# Patient Record
Sex: Male | Born: 1960 | Race: Black or African American | Hispanic: No | Marital: Married | Smoking: Former smoker
Health system: Southern US, Community
[De-identification: ages and names within clinical notes are randomized; demographics above are authoritative.]

## PROBLEM LIST (undated history)

## (undated) DIAGNOSIS — F329 Major depressive disorder, single episode, unspecified: Secondary | ICD-10-CM

## (undated) DIAGNOSIS — F32A Depression, unspecified: Secondary | ICD-10-CM

## (undated) DIAGNOSIS — L80 Vitiligo: Secondary | ICD-10-CM

## (undated) DIAGNOSIS — C801 Malignant (primary) neoplasm, unspecified: Secondary | ICD-10-CM

## (undated) DIAGNOSIS — I509 Heart failure, unspecified: Secondary | ICD-10-CM

## (undated) DIAGNOSIS — E785 Hyperlipidemia, unspecified: Secondary | ICD-10-CM

## (undated) DIAGNOSIS — G4730 Sleep apnea, unspecified: Secondary | ICD-10-CM

## (undated) DIAGNOSIS — I10 Essential (primary) hypertension: Secondary | ICD-10-CM

## (undated) DIAGNOSIS — N19 Unspecified kidney failure: Secondary | ICD-10-CM

## (undated) HISTORY — DX: Unspecified kidney failure: N19

## (undated) HISTORY — DX: Major depressive disorder, single episode, unspecified: F32.9

## (undated) HISTORY — DX: Hyperlipidemia, unspecified: E78.5

## (undated) HISTORY — DX: Depression, unspecified: F32.A

## (undated) HISTORY — PX: PROSTATE BIOPSY: SHX241

---

## 2010-01-12 DIAGNOSIS — I480 Paroxysmal atrial fibrillation: Secondary | ICD-10-CM | POA: Insufficient documentation

## 2010-01-12 DIAGNOSIS — G4733 Obstructive sleep apnea (adult) (pediatric): Secondary | ICD-10-CM | POA: Insufficient documentation

## 2014-12-13 ENCOUNTER — Encounter: Payer: Self-pay | Admitting: *Deleted

## 2014-12-16 ENCOUNTER — Encounter
Admission: RE | Disposition: A | Discharge status: Home / Self Care | Payer: Self-pay | Source: Ambulatory Visit | Attending: Gastroenterology

## 2014-12-16 ENCOUNTER — Encounter: Payer: Self-pay | Admitting: *Deleted

## 2014-12-16 ENCOUNTER — Ambulatory Visit
Admission: RE | Admit: 2014-12-16 | Discharge: 2014-12-16 | Disposition: A | Discharge status: Home / Self Care | Payer: No Typology Code available for payment source | Source: Ambulatory Visit | Attending: Gastroenterology | Admitting: Gastroenterology

## 2014-12-16 ENCOUNTER — Ambulatory Visit: Payer: No Typology Code available for payment source | Admitting: Anesthesiology

## 2014-12-16 DIAGNOSIS — Z1211 Encounter for screening for malignant neoplasm of colon: Secondary | ICD-10-CM | POA: Diagnosis not present

## 2014-12-16 DIAGNOSIS — G4730 Sleep apnea, unspecified: Secondary | ICD-10-CM | POA: Insufficient documentation

## 2014-12-16 DIAGNOSIS — Z8371 Family history of colonic polyps: Secondary | ICD-10-CM | POA: Insufficient documentation

## 2014-12-16 DIAGNOSIS — I509 Heart failure, unspecified: Secondary | ICD-10-CM | POA: Insufficient documentation

## 2014-12-16 DIAGNOSIS — Z7982 Long term (current) use of aspirin: Secondary | ICD-10-CM | POA: Diagnosis not present

## 2014-12-16 DIAGNOSIS — K640 First degree hemorrhoids: Secondary | ICD-10-CM | POA: Insufficient documentation

## 2014-12-16 DIAGNOSIS — D649 Anemia, unspecified: Secondary | ICD-10-CM | POA: Insufficient documentation

## 2014-12-16 DIAGNOSIS — K5730 Diverticulosis of large intestine without perforation or abscess without bleeding: Secondary | ICD-10-CM | POA: Diagnosis not present

## 2014-12-16 DIAGNOSIS — I110 Hypertensive heart disease with heart failure: Secondary | ICD-10-CM | POA: Diagnosis not present

## 2014-12-16 DIAGNOSIS — Z87891 Personal history of nicotine dependence: Secondary | ICD-10-CM | POA: Diagnosis not present

## 2014-12-16 HISTORY — DX: Heart failure, unspecified: I50.9

## 2014-12-16 HISTORY — DX: Vitiligo: L80

## 2014-12-16 HISTORY — PX: COLONOSCOPY WITH PROPOFOL: SHX5780

## 2014-12-16 HISTORY — DX: Sleep apnea, unspecified: G47.30

## 2014-12-16 HISTORY — DX: Essential (primary) hypertension: I10

## 2014-12-16 SURGERY — COLONOSCOPY WITH PROPOFOL
Anesthesia: General

## 2014-12-16 MED ORDER — SODIUM CHLORIDE 0.9 % IV SOLN
INTRAVENOUS | Status: DC
  Administered 2014-12-16: 08:00:00 via INTRAVENOUS

## 2014-12-16 MED ORDER — PROPOFOL 10 MG/ML IV BOLUS
INTRAVENOUS | Status: DC | PRN
  Administered 2014-12-16: 70 mg via INTRAVENOUS
  Administered 2014-12-16 (×2): 40 mg via INTRAVENOUS

## 2014-12-16 MED ORDER — PROPOFOL 500 MG/50ML IV EMUL
INTRAVENOUS | Status: DC | PRN
  Administered 2014-12-16: 150 ug/kg/min via INTRAVENOUS

## 2014-12-16 MED ORDER — LIDOCAINE HCL (CARDIAC) 20 MG/ML IV SOLN
INTRAVENOUS | Status: DC | PRN
  Administered 2014-12-16: 60 mg via INTRAVENOUS

## 2014-12-16 NOTE — H&P (Signed)
  Primary Care Physician:  Valera Castle, MD  Pre-Procedure History & Physical: HPI:  Alejandro Winters is a 54 y.o. male is here for an colonoscopy.   Past Medical History  Diagnosis Date  . Hypertension   . Sleep apnea   . Vitiligo   . CHF (congestive heart failure) ([place])     History reviewed. No pertinent past surgical history.  Prior to Admission medications   Medication Sig Start Date End Date Taking? Authorizing Provider  aspirin EC 81 MG tablet Take 81 mg by mouth daily.   Yes Historical Provider, MD  co-enzyme Q-10 30 MG capsule Take 30 mg by mouth 3 (three) times daily.   Yes Historical Provider, MD  hydrochlorothiazide (HYDRODIURIL) 25 MG tablet Take 25 mg by mouth daily.   Yes Historical Provider, MD  polyethylene glycol-electrolytes (NULYTELY/GOLYTELY) 420 G solution Take 4,000 mLs by mouth once.   Yes Historical Provider, MD    Allergies as of 11/20/2014  . (Not on File)    History reviewed. No pertinent family history.  Social History   Social History  . Marital Status: Married    Spouse Name: N/A  . Number of Children: N/A  . Years of Education: N/A   Occupational History  . Not on file.   Social History Main Topics  . Smoking status: Former Smoker -- 0.50 packs/day for 1 years    Types: Cigarettes    Quit date: 10/28/1998  . Smokeless tobacco: Never Used  . Alcohol Use: No  . Drug Use: Yes    Special: Marijuana  . Sexual Activity: Not on file   Other Topics Concern  . Not on file   Social History Narrative     Physical Exam: BP 154/93 mmHg  Pulse 73  Temp(Src) 98.8 F (37.1 C) (Tympanic)  Resp 12  Ht 5\' 11"  (1.803 m)  Wt 120.203 kg (265 lb)  BMI 36.98 kg/m2  SpO2 98% General:   Alert,  pleasant and cooperative in NAD Head:  Normocephalic and atraumatic. Neck:  Supple; no masses or thyromegaly. Lungs:  Clear throughout to auscultation.    Heart:  Regular rate and rhythm. Abdomen:  Soft, nontender and nondistended. Normal bowel  sounds, without guarding, and without rebound.   Neurologic:  Alert and  oriented x4;  grossly normal neurologically.  Impression/Plan: Alejandro Winters is here for an colonoscopy to be performed for screening  Risks, benefits, limitations, and alternatives regarding  colonoscopy have been reviewed with the patient.  Questions have been answered.  All parties agreeable.   Josefine Class, MD  12/16/2014, 8:12 AM

## 2014-12-16 NOTE — Op Note (Signed)
[facility] Patient Name: Alejandro Winters Procedure Date: 12/16/2014 8:13 AM MRN: [id number] Account #: [id number] Date of Birth: 1961/01/02 Admit Type: Outpatient Age: 54 Room: [facility] ENDO ROOM 1 Gender: Male Note Status: Finalized Procedure:         Colonoscopy Indications:       Colon cancer screening in patient at increased risk:                     Family history of colon polyps, This is the patient's                     first colonoscopy Patient Profile:   This is a 54 year old male. Providers:         Gerrit Heck. Rayann Heman, MD Referring MD:      Wynona Canes. Kym Groom, MD (Referring MD) Medicines:         Propofol per Anesthesia Complications:     No immediate complications. Procedure:         Pre-Anesthesia Assessment:                    - Prior to the procedure, a History and Physical was                     performed, and patient medications, allergies and                     sensitivities were reviewed. The patient's tolerance of                     previous anesthesia was reviewed.                    After obtaining informed consent, the colonoscope was                     passed under direct vision. Throughout the procedure, the                     patient's blood pressure, pulse, and oxygen saturations                     were monitored continuously. The Colonoscope was                     introduced through the anus and advanced to the the cecum,                     identified by appendiceal orifice and ileocecal valve. The                     colonoscopy was performed without difficulty. The patient                     tolerated the procedure well. The quality of the bowel                     preparation was excellent. Findings:      The perianal and digital rectal examinations were normal.      Many small and large-mouthed diverticula were found in the sigmoid       colon, in the descending colon and at the splenic flexure.      Internal  hemorrhoids were found during retroflexion. The hemorrhoids       were Grade I (  internal hemorrhoids that do not prolapse).      The exam was otherwise without abnormality. Impression:        - Diverticulosis in the sigmoid colon, in the descending                     colon and at the splenic flexure.                    - Internal hemorrhoids.                    - The examination was otherwise normal.                    - No specimens collected. Recommendation:    - Observe patient in GI recovery unit.                    - High fiber diet.                    - Continue present medications.                    - Repeat colonoscopy in 5 years for screening purposes.                    - Return to referring physician.                    - The findings and recommendations were discussed with the                     patient.                    - The findings and recommendations were discussed with the                     patient's family. Procedure Code(s): --- Professional ---                    [phone], Colonoscopy, flexible; diagnostic, including                     collection of specimen(s) by brushing or washing, when                     performed (separate procedure) CPT copyright 2014 American [employer] Association. All rights reserved. The codes documented in this report are preliminary and upon coder review may  be revised to meet current compliance requirements. Mellody Life, MD 12/16/2014 8:38:48 AM This report has been signed electronically. Number of Addenda: 0 Note Initiated On: 12/16/2014 8:13 AM Scope Withdrawal Time: 0 hours 9 minutes 19 seconds  Total Procedure Duration: 0 hours 15 minutes 23 seconds       [facility]

## 2014-12-16 NOTE — Transfer of Care (Signed)
Immediate Anesthesia Transfer of Care Note  Patient: Alejandro Winters  Procedure(s) Performed: Procedure(s): COLONOSCOPY WITH PROPOFOL (N/A)  Patient Location: PACU  Anesthesia Type:General  Level of Consciousness: awake, alert  and oriented  Airway & Oxygen Therapy: Patient Spontanous Breathing and Patient connected to nasal cannula oxygen  Post-op Assessment: Report given to RN and Post -op Vital signs reviewed and stable  Post vital signs: stable  Last Vitals:  Filed Vitals:   12/16/14 0841  BP: 136/96  Pulse: 94  Temp: 36 C  Resp: 16    Complications: No apparent anesthesia complications

## 2014-12-16 NOTE — Anesthesia Postprocedure Evaluation (Signed)
  Anesthesia Post-op Note  Patient: Alejandro Winters  Procedure(s) Performed: Procedure(s): COLONOSCOPY WITH PROPOFOL (N/A)  Anesthesia type:General  Patient location: PACU  Post pain: Pain level controlled  Post assessment: Post-op Vital signs reviewed, Patient's Cardiovascular Status Stable, Respiratory Function Stable, Patent Airway and No signs of Nausea or vomiting  Post vital signs: Reviewed and stable  Last Vitals:  Filed Vitals:   12/16/14 0910  BP: 141/94  Pulse: 68  Temp:   Resp: 9    Level of consciousness: awake, alert  and patient cooperative  Complications: No apparent anesthesia complications

## 2014-12-16 NOTE — Anesthesia Preprocedure Evaluation (Signed)
Anesthesia Evaluation  Patient identified by MRN, date of birth, ID band Patient awake    Reviewed: Allergy & Precautions, NPO status , Patient's Chart, lab work & pertinent test results  History of Anesthesia Complications Negative for: history of anesthetic complications  Airway Mallampati: II       Dental  (+) Missing, Chipped   Pulmonary sleep apnea and Continuous Positive Airway Pressure Ventilation , former smoker,           Cardiovascular hypertension, Pt. on medications +CHF       Neuro/Psych negative neurological ROS     GI/Hepatic negative GI ROS, Neg liver ROS,   Endo/Other  negative endocrine ROS  Renal/GU negative Renal ROS     Musculoskeletal   Abdominal   Peds  Hematology  (+) anemia ,   Anesthesia Other Findings   Reproductive/Obstetrics                             Anesthesia Physical Anesthesia Plan  ASA: III  Anesthesia Plan: General   Post-op Pain Management:    Induction: Intravenous  Airway Management Planned: Nasal Cannula  Additional Equipment:   Intra-op Plan:   Post-operative Plan:   Informed Consent: I have reviewed the patients History and Physical, chart, labs and discussed the procedure including the risks, benefits and alternatives for the proposed anesthesia with the patient or authorized representative who has indicated his/her understanding and acceptance.     Plan Discussed with:   Anesthesia Plan Comments:         Anesthesia Quick Evaluation

## 2014-12-16 NOTE — Discharge Instructions (Signed)

## 2014-12-18 ENCOUNTER — Encounter: Payer: Self-pay | Admitting: Gastroenterology

## 2015-05-08 ENCOUNTER — Ambulatory Visit (INDEPENDENT_AMBULATORY_CARE_PROVIDER_SITE_OTHER): Payer: BLUE CROSS/BLUE SHIELD | Admitting: Family Medicine

## 2015-05-08 ENCOUNTER — Encounter: Payer: Self-pay | Admitting: Family Medicine

## 2015-05-08 VITALS — BP 144/98 | HR 72 | Ht 71.0 in | Wt 287.0 lb

## 2015-05-08 DIAGNOSIS — I5032 Chronic diastolic (congestive) heart failure: Secondary | ICD-10-CM | POA: Diagnosis not present

## 2015-05-08 DIAGNOSIS — Z7189 Other specified counseling: Secondary | ICD-10-CM

## 2015-05-08 DIAGNOSIS — Z7689 Persons encountering health services in other specified circumstances: Secondary | ICD-10-CM

## 2015-05-08 DIAGNOSIS — Z8679 Personal history of other diseases of the circulatory system: Secondary | ICD-10-CM | POA: Diagnosis not present

## 2015-05-08 DIAGNOSIS — G4730 Sleep apnea, unspecified: Secondary | ICD-10-CM

## 2015-05-08 DIAGNOSIS — R002 Palpitations: Secondary | ICD-10-CM

## 2015-05-08 DIAGNOSIS — E785 Hyperlipidemia, unspecified: Secondary | ICD-10-CM

## 2015-05-08 DIAGNOSIS — I10 Essential (primary) hypertension: Secondary | ICD-10-CM

## 2015-05-08 MED ORDER — LISINOPRIL 10 MG PO TABS: 10.0000 mg | ORAL_TABLET | Freq: Every day | ORAL | Status: DC

## 2015-05-08 NOTE — Progress Notes (Signed)
Name: Alejandro Winters   MRN: [id number]    DOB: 12/25/1960   Date:05/08/2015       Progress Note  Subjective  Chief Complaint  Chief Complaint  Patient presents with  . Establish Care  . Sleep Apnea    needs new equipment  . Hypertension    questions about HCTZ  . Palpitations    Hypertension This is a chronic problem. The current episode started more than 1 year ago. The problem has been waxing and waning since onset. The problem is uncontrolled. Pertinent negatives include no anxiety, blurred vision, chest pain, headaches, malaise/fatigue, neck pain, orthopnea, palpitations, peripheral edema, PND, shortness of breath or sweats. There are no associated agents to hypertension. Risk factors for coronary artery disease include male gender and smoking/tobacco exposure. The current treatment provides no improvement. Compliance problems include medication side effects.  There is no history of angina, kidney disease, CAD/MI, CVA, heart failure, left ventricular hypertrophy, PVD, renovascular disease or retinopathy. There is no history of chronic renal disease or a hypertension causing med.  Congestive Heart Failure Presents for initial visit. The disease course has been stable. Pertinent negatives include no abdominal pain, chest pain, chest pressure, claudication, edema, fatigue, muscle weakness, near-syncope, nocturia, orthopnea, palpitations, paroxysmal nocturnal dyspnea, shortness of breath or unexpected weight change. The symptoms have been stable. Past treatments include ACE inhibitors and beta blockers. Compliance with prior treatments has been variable. Prior compliance problems include insurance issues and medication issues. His past medical history is significant for anemia. There is no history of hyperthyroidism.  Palpitations  This is a recurrent problem. The problem occurs intermittently. Nothing (supine) aggravates the symptoms. Pertinent negatives include no anxiety, chest fullness, chest  pain, coughing, dizziness, fever, malaise/fatigue, nausea, near-syncope or shortness of breath. He has tried beta blockers for the symptoms. There is no history of heart disease, hyperthyroidism or a valve disorder.    No problem-specific assessment & plan notes found for this encounter.   Past Medical History  Diagnosis Date  . Hypertension   . Sleep apnea   . Vitiligo   . CHF (congestive heart failure) [facility])     Past Surgical History  Procedure Laterality Date  . Colonoscopy with propofol N/A 12/16/2014    Procedure: COLONOSCOPY WITH PROPOFOL;  Surgeon: Josefine Class, MD;  Location: [facility] ENDOSCOPY;  Service: Endoscopy;  Laterality: N/A;    Family History  Problem Relation Age of Onset  . Hypertension Mother   . Cancer Father   . Hypertension Father     Social History   Social History  . Marital Status: Married    Spouse Name: N/A  . Number of Children: N/A  . Years of Education: N/A   Occupational History  . Not on file.   Social History Main Topics  . Smoking status: Former Smoker -- 0.50 packs/day for 1 years    Types: Cigarettes    Quit date: 10/28/1998  . Smokeless tobacco: Never Used  . Alcohol Use: 0.0 oz/week    0 Standard drinks or equivalent per week  . Drug Use: Yes    Special: Marijuana     Comment: marijuana  . Sexual Activity: Not Currently   Other Topics Concern  . Not on file   Social History Narrative    No Known Allergies   Review of Systems  Constitutional: Negative for fever, chills, weight loss, malaise/fatigue, fatigue and unexpected weight change.  HENT: Negative for ear discharge, ear pain and sore throat.   Eyes: Negative  for blurred vision.  Respiratory: Negative for cough, sputum production, shortness of breath and wheezing.   Cardiovascular: Negative for chest pain, palpitations, orthopnea, claudication, leg swelling, PND and near-syncope.  Gastrointestinal: Negative for heartburn, nausea, abdominal pain, diarrhea,  constipation, blood in stool and melena.  Genitourinary: Negative for dysuria, urgency, frequency, hematuria and nocturia.  Musculoskeletal: Negative for myalgias, back pain, joint pain, muscle weakness and neck pain.  Skin: Negative for rash.  Neurological: Negative for dizziness, tingling, sensory change, focal weakness and headaches.  Endo/Heme/Allergies: Negative for environmental allergies and polydipsia. Does not bruise/bleed easily.  Psychiatric/Behavioral: Negative for depression and suicidal ideas. The patient is not nervous/anxious and does not have insomnia.      Objective  Filed Vitals:   05/08/15 1039  BP: 144/98  Pulse: 72  Height: 5\' 11"  (1.803 m)  Weight: 287 lb (130.182 kg)    Physical Exam  Constitutional: He is oriented to person, place, and time and well-developed, well-nourished, and in no distress.  HENT:  Head: Normocephalic.  Right Ear: External ear normal.  Left Ear: External ear normal.  Nose: Nose normal.  Mouth/Throat: Oropharynx is clear and moist.  Eyes: Conjunctivae and EOM are normal. Pupils are equal, round, and reactive to light. Right eye exhibits no discharge. Left eye exhibits no discharge. No scleral icterus.  Neck: Normal range of motion. Neck supple. No JVD present. No tracheal deviation present. No thyromegaly present.  Cardiovascular: Normal rate, regular rhythm, normal heart sounds and intact distal pulses.  Exam reveals no gallop and no friction rub.   No murmur heard. Pulmonary/Chest: Breath sounds normal. No respiratory distress. He has no wheezes. He has no rales.  Abdominal: Soft. Bowel sounds are normal. He exhibits no mass. There is no hepatosplenomegaly. There is no tenderness. There is no rebound, no guarding and no CVA tenderness.  Musculoskeletal: Normal range of motion. He exhibits no edema or tenderness.  Lymphadenopathy:    He has no cervical adenopathy.  Neurological: He is alert and oriented to person, place, and time. He  has normal sensation, normal strength, normal reflexes and intact cranial nerves. No cranial nerve deficit.  Skin: Skin is warm. No rash noted.  Psychiatric: Mood and affect normal.      Assessment & Plan  Problem List Items Addressed This Visit    None    Visit Diagnoses    Encounter to establish care with new doctor    -  Primary    Palpitation        Relevant Orders    EKG 12-Lead (Completed)    Chronic diastolic congestive heart failure (HCC)        Relevant Medications    lisinopril (PRINIVIL,ZESTRIL) 10 MG tablet    Other Relevant Orders    Ambulatory referral to Cardiology    Essential hypertension        Relevant Medications    lisinopril (PRINIVIL,ZESTRIL) 10 MG tablet    History of atrial fibrillation        Hyperlipidemia        Relevant Medications    lisinopril (PRINIVIL,ZESTRIL) 10 MG tablet    Sleep apnea             Dr. Zuriel Yeaman [facility]  05/08/2015

## 2015-05-22 ENCOUNTER — Ambulatory Visit (INDEPENDENT_AMBULATORY_CARE_PROVIDER_SITE_OTHER): Payer: BLUE CROSS/BLUE SHIELD | Admitting: Family Medicine

## 2015-05-22 ENCOUNTER — Encounter: Payer: Self-pay | Admitting: Family Medicine

## 2015-05-22 VITALS — BP 130/98 | HR 68 | Ht 71.0 in | Wt 286.0 lb

## 2015-05-22 DIAGNOSIS — I10 Essential (primary) hypertension: Secondary | ICD-10-CM | POA: Diagnosis not present

## 2015-05-22 NOTE — Progress Notes (Signed)
Name: Alejandro Winters   MRN: [id number]    DOB: 1960/08/29   Date:05/22/2015       Progress Note  Subjective  Chief Complaint  Chief Complaint  Patient presents with  . Hypertension    Hypertension This is a chronic problem. The current episode started more than 1 year ago. The problem has been gradually improving since onset. The problem is uncontrolled. Pertinent negatives include no anxiety, blurred vision, chest pain, headaches, malaise/fatigue, neck pain, orthopnea, palpitations, peripheral edema, PND, shortness of breath or sweats. There are no associated agents to hypertension.    No problem-specific assessment & plan notes found for this encounter.   Past Medical History  Diagnosis Date  . Hypertension   . Sleep apnea   . Vitiligo   . CHF (congestive heart failure) [facility])     Past Surgical History  Procedure Laterality Date  . Colonoscopy with propofol N/A 12/16/2014    Procedure: COLONOSCOPY WITH PROPOFOL;  Surgeon: Josefine Class, MD;  Location: [facility] ENDOSCOPY;  Service: Endoscopy;  Laterality: N/A;    Family History  Problem Relation Age of Onset  . Hypertension Mother   . Cancer Father   . Hypertension Father     Social History   Social History  . Marital Status: Married    Spouse Name: N/A  . Number of Children: N/A  . Years of Education: N/A   Occupational History  . Not on file.   Social History Main Topics  . Smoking status: Former Smoker -- 0.50 packs/day for 1 years    Types: Cigarettes    Quit date: 10/28/1998  . Smokeless tobacco: Never Used  . Alcohol Use: 0.0 oz/week    0 Standard drinks or equivalent per week  . Drug Use: Yes    Special: Marijuana     Comment: marijuana  . Sexual Activity: Not Currently   Other Topics Concern  . Not on file   Social History Narrative    No Known Allergies   Review of Systems  Constitutional: Negative for fever, chills, weight loss and malaise/fatigue.  HENT: Negative for ear discharge,  ear pain and sore throat.   Eyes: Negative for blurred vision.  Respiratory: Negative for cough, sputum production, shortness of breath and wheezing.   Cardiovascular: Negative for chest pain, palpitations, orthopnea, leg swelling and PND.  Gastrointestinal: Negative for heartburn, nausea, abdominal pain, diarrhea, constipation, blood in stool and melena.  Genitourinary: Negative for dysuria, urgency, frequency and hematuria.  Musculoskeletal: Negative for myalgias, back pain, joint pain and neck pain.  Skin: Negative for rash.  Neurological: Negative for dizziness, tingling, sensory change, focal weakness and headaches.  Endo/Heme/Allergies: Negative for environmental allergies and polydipsia. Does not bruise/bleed easily.  Psychiatric/Behavioral: Negative for depression and suicidal ideas. The patient is not nervous/anxious and does not have insomnia.      Objective  Filed Vitals:   05/22/15 0919  BP: 130/98  Pulse: 68  Height: 5\' 11"  (1.803 m)  Weight: 286 lb (129.729 kg)    Physical Exam  Constitutional: He is oriented to person, place, and time and well-developed, well-nourished, and in no distress.  HENT:  Head: Normocephalic.  Right Ear: External ear normal.  Left Ear: External ear normal.  Nose: Nose normal.  Mouth/Throat: Oropharynx is clear and moist.  Eyes: Conjunctivae and EOM are normal. Pupils are equal, round, and reactive to light. Right eye exhibits no discharge. Left eye exhibits no discharge. No scleral icterus.  Neck: Normal range of motion. Neck supple.  No JVD present. No tracheal deviation present. No thyromegaly present.  Cardiovascular: Normal rate, regular rhythm, normal heart sounds and intact distal pulses.  Exam reveals no gallop and no friction rub.   No murmur heard. Pulmonary/Chest: Breath sounds normal. No respiratory distress. He has no wheezes. He has no rales.  Abdominal: Soft. Bowel sounds are normal. He exhibits no mass. There is no  hepatosplenomegaly. There is no tenderness. There is no rebound, no guarding and no CVA tenderness.  Musculoskeletal: Normal range of motion. He exhibits no edema or tenderness.  Lymphadenopathy:    He has no cervical adenopathy.  Neurological: He is alert and oriented to person, place, and time. He has normal sensation, normal strength, normal reflexes and intact cranial nerves. No cranial nerve deficit.  Skin: Skin is warm. No rash noted.  Psychiatric: Mood and affect normal.  Nursing note and vitals reviewed.     Assessment & Plan  Problem List Items Addressed This Visit    None    Visit Diagnoses    Essential hypertension    -  Primary    await cardiology         Dr. Otilio Miu [facility]  05/22/2015

## 2015-06-20 ENCOUNTER — Telehealth: Payer: Self-pay

## 2015-06-20 NOTE — Telephone Encounter (Signed)
Needs refill on Lisinopril since Cardio had him up to 2 daily. He is now out and needs refill enough to do 20mg  daily. He also said on VM that cath has been rescheduled due to emergency in office that day. This was on VM after Dr.Jones left office.

## 2015-06-23 ENCOUNTER — Other Ambulatory Visit: Payer: Self-pay

## 2015-06-23 DIAGNOSIS — I10 Essential (primary) hypertension: Secondary | ICD-10-CM

## 2015-06-23 DIAGNOSIS — I5032 Chronic diastolic (congestive) heart failure: Secondary | ICD-10-CM

## 2015-06-23 MED ORDER — LISINOPRIL 10 MG PO TABS: 10.0000 mg | ORAL_TABLET | Freq: Every day | ORAL | Status: DC

## 2015-06-26 ENCOUNTER — Other Ambulatory Visit: Payer: Self-pay | Admitting: Family Medicine

## 2015-06-26 DIAGNOSIS — I5032 Chronic diastolic (congestive) heart failure: Secondary | ICD-10-CM

## 2015-06-26 DIAGNOSIS — I10 Essential (primary) hypertension: Secondary | ICD-10-CM

## 2015-06-26 MED ORDER — LISINOPRIL 20 MG PO TABS: 20.0000 mg | ORAL_TABLET | Freq: Every day | ORAL | Status: DC

## 2015-06-26 NOTE — Telephone Encounter (Signed)
Has this been taken care of? It was in my basket this am

## 2015-06-26 NOTE — Addendum Note (Signed)
Addended by: Adline Potter on: 06/26/2015 02:57 PM   Modules accepted: Medications

## 2015-06-26 NOTE — Telephone Encounter (Signed)
Done

## 2015-06-27 ENCOUNTER — Other Ambulatory Visit: Payer: Self-pay | Admitting: Family Medicine

## 2015-08-18 ENCOUNTER — Other Ambulatory Visit: Payer: Self-pay

## 2015-09-08 ENCOUNTER — Other Ambulatory Visit: Payer: Self-pay

## 2015-09-11 ENCOUNTER — Other Ambulatory Visit: Payer: Self-pay

## 2015-10-30 ENCOUNTER — Other Ambulatory Visit: Payer: Self-pay

## 2016-08-11 ENCOUNTER — Encounter: Payer: BLUE CROSS/BLUE SHIELD | Admitting: Family Medicine

## 2016-08-13 ENCOUNTER — Encounter: Payer: Self-pay | Admitting: Family Medicine

## 2016-08-13 ENCOUNTER — Ambulatory Visit
Admission: RE | Admit: 2016-08-13 | Discharge: 2016-08-13 | Disposition: A | Discharge status: Home / Self Care | Payer: BLUE CROSS/BLUE SHIELD | Source: Ambulatory Visit | Attending: Family Medicine | Admitting: Family Medicine

## 2016-08-13 ENCOUNTER — Ambulatory Visit (INDEPENDENT_AMBULATORY_CARE_PROVIDER_SITE_OTHER): Payer: BLUE CROSS/BLUE SHIELD | Admitting: Family Medicine

## 2016-08-13 VITALS — BP 130/98 | HR 60 | Ht 71.0 in | Wt 296.0 lb

## 2016-08-13 DIAGNOSIS — Z0000 Encounter for general adult medical examination without abnormal findings: Secondary | ICD-10-CM

## 2016-08-13 DIAGNOSIS — M5441 Lumbago with sciatica, right side: Secondary | ICD-10-CM | POA: Insufficient documentation

## 2016-08-13 DIAGNOSIS — I10 Essential (primary) hypertension: Secondary | ICD-10-CM | POA: Diagnosis not present

## 2016-08-13 DIAGNOSIS — F329 Major depressive disorder, single episode, unspecified: Secondary | ICD-10-CM | POA: Diagnosis not present

## 2016-08-13 DIAGNOSIS — I5032 Chronic diastolic (congestive) heart failure: Secondary | ICD-10-CM | POA: Diagnosis not present

## 2016-08-13 DIAGNOSIS — M5442 Lumbago with sciatica, left side: Secondary | ICD-10-CM | POA: Insufficient documentation

## 2016-08-13 DIAGNOSIS — G8929 Other chronic pain: Secondary | ICD-10-CM | POA: Insufficient documentation

## 2016-08-13 DIAGNOSIS — R3911 Hesitancy of micturition: Secondary | ICD-10-CM

## 2016-08-13 DIAGNOSIS — M5136 Other intervertebral disc degeneration, lumbar region: Secondary | ICD-10-CM | POA: Insufficient documentation

## 2016-08-13 DIAGNOSIS — Z1211 Encounter for screening for malignant neoplasm of colon: Secondary | ICD-10-CM

## 2016-08-13 DIAGNOSIS — Z6841 Body Mass Index (BMI) 40.0 and over, adult: Secondary | ICD-10-CM | POA: Diagnosis not present

## 2016-08-13 DIAGNOSIS — F32A Depression, unspecified: Secondary | ICD-10-CM

## 2016-08-13 LAB — HEMOCCULT GUIAC POC 1CARD (OFFICE): FECAL OCCULT BLD: NEGATIVE

## 2016-08-13 MED ORDER — SERTRALINE HCL 50 MG PO TABS: 50.0000 mg | ORAL_TABLET | Freq: Every day | ORAL | 3 refills | Status: DC

## 2016-08-13 MED ORDER — HYDROCHLOROTHIAZIDE 25 MG PO TABS: 25.0000 mg | ORAL_TABLET | Freq: Every day | ORAL | 1 refills | Status: DC

## 2016-08-13 MED ORDER — LISINOPRIL 20 MG PO TABS: 20.0000 mg | ORAL_TABLET | Freq: Every day | ORAL | 1 refills | Status: DC

## 2016-08-13 NOTE — Progress Notes (Signed)
Name: Alejandro Winters   MRN: [id number]    DOB: 07/16/1960   Date:08/13/2016       Progress Note  Subjective  Chief Complaint  Chief Complaint  Patient presents with  . Annual Exam  . Back Pain    lower back pain off and on that seems to occur when he has "put on weight"- pain is running around to R) leg when walking    Back Pain  This is a recurrent problem. The current episode started more than 1 year ago. The problem occurs intermittently. The problem has been waxing and waning since onset. The pain is present in the lumbar spine. The quality of the pain is described as aching. The pain radiates to the left thigh and right thigh. The pain is at a severity of 4/10. The pain is mild. The symptoms are aggravated by bending and twisting. Stiffness is present in the morning. Associated symptoms include leg pain and paresthesias. Pertinent negatives include no abdominal pain, bladder incontinence, bowel incontinence, chest pain, dysuria, fever, headaches, numbness, paresis, tingling or weight loss.  Depression         This is a new problem.  The current episode started more than 1 year ago.   The onset quality is gradual.   The problem has been gradually worsening since onset.  Associated symptoms include decreased concentration, irritable and sad.  Associated symptoms include no helplessness, no hopelessness, does not have insomnia, no restlessness, no decreased interest, no myalgias, no headaches, no indigestion and no suicidal ideas.  Past treatments include nothing.   No problem-specific Assessment & Plan notes found for this encounter.   Past Medical History:  Diagnosis Date  . CHF (congestive heart failure) ([place])   . Hypertension   . Sleep apnea   . Vitiligo     Past Surgical History:  Procedure Laterality Date  . COLONOSCOPY WITH PROPOFOL N/A 12/16/2014   Procedure: COLONOSCOPY WITH PROPOFOL;  Surgeon: Josefine Class, MD;  Location: [facility] ENDOSCOPY;  Service: Endoscopy;   Laterality: N/A;    Family History  Problem Relation Age of Onset  . Hypertension Mother   . Cancer Father   . Hypertension Father     Social History   Social History  . Marital status: Married    Spouse name: N/A  . Number of children: N/A  . Years of education: N/A   Occupational History  . Not on file.   Social History Main Topics  . Smoking status: Former Smoker    Packs/day: 0.50    Years: 1.00    Types: Cigarettes    Quit date: 10/28/1998  . Smokeless tobacco: Never Used  . Alcohol use 0.0 oz/week  . Drug use: Yes    Types: Marijuana     Comment: marijuana  . Sexual activity: Not Currently   Other Topics Concern  . Not on file   Social History Narrative  . No narrative on file    No Known Allergies  Outpatient Medications Prior to Visit  Medication Sig Dispense Refill  . aspirin EC 81 MG tablet Take 81 mg by mouth daily.    Marland Kitchen co-enzyme Q-10 30 MG capsule Take 200 mg by mouth daily.     Marland Kitchen lisinopril (PRINIVIL,ZESTRIL) 20 MG tablet Take 1 tablet (20 mg total) by mouth daily. 30 tablet 2  . Fish Oil-Krill Oil (KRILL OIL PLUS PO) Take 1 capsule by mouth daily. 1250 mg- 2 tabs daily    . lisinopril (PRINIVIL,ZESTRIL) 10 MG tablet  TAKE 1 TABLET BY MOUTH DAILY 30 tablet 0   No facility-administered medications prior to visit.     Review of Systems  Constitutional: Negative for chills, fever, malaise/fatigue and weight loss.  HENT: Negative for ear discharge, ear pain and sore throat.   Eyes: Negative for blurred vision.  Respiratory: Negative for cough, sputum production, shortness of breath and wheezing.   Cardiovascular: Negative for chest pain, palpitations and leg swelling.  Gastrointestinal: Negative for abdominal pain, blood in stool, bowel incontinence, constipation, diarrhea, heartburn, melena and nausea.  Genitourinary: Negative for bladder incontinence, dysuria, frequency, hematuria and urgency.  Musculoskeletal: Positive for back pain. Negative  for joint pain, myalgias and neck pain.  Skin: Negative for rash.  Neurological: Positive for paresthesias. Negative for dizziness, tingling, sensory change, focal weakness, numbness and headaches.  Endo/Heme/Allergies: Negative for environmental allergies and polydipsia. Does not bruise/bleed easily.  Psychiatric/Behavioral: Positive for decreased concentration. Negative for depression and suicidal ideas. The patient is not nervous/anxious and does not have insomnia.      Objective  Vitals:   08/13/16 0839  BP: (!) 130/98  Pulse: 60  Weight: 296 lb (134.3 kg)  Height: 5\' 11"  (1.803 m)    Physical Exam  Constitutional: He is oriented to person, place, and time and well-developed, well-nourished, and in no distress. Vital signs are normal. He is irritable.  HENT:  Head: Normocephalic.  Right Ear: Tympanic membrane, external ear and ear canal normal.  Left Ear: Tympanic membrane, external ear and ear canal normal.  Nose: Nose normal.  Mouth/Throat: Uvula is midline, oropharynx is clear and moist and mucous membranes are normal.  Eyes: Conjunctivae, EOM and lids are normal. Pupils are equal, round, and reactive to light. Right eye exhibits no discharge. Left eye exhibits no discharge. No scleral icterus.  Fundoscopic exam:      The right eye shows no arteriolar narrowing and no AV nicking.       The left eye shows no arteriolar narrowing and no AV nicking.  Neck: Trachea normal and normal range of motion. Neck supple. Normal carotid pulses, no hepatojugular reflux and no JVD present. Carotid bruit is not present. No tracheal deviation present. No thyromegaly present.  Cardiovascular: Normal rate, regular rhythm, S1 normal, S2 normal, normal heart sounds, intact distal pulses and normal pulses.  Exam reveals no gallop, no S3, no S4 and no friction rub.   No murmur heard. Pulmonary/Chest: Breath sounds normal. No respiratory distress. He has no wheezes. He has no rales. Right breast  exhibits no mass. Left breast exhibits no mass.  Abdominal: Soft. Bowel sounds are normal. He exhibits no mass. There is no hepatosplenomegaly. There is no tenderness. There is no rebound, no guarding and no CVA tenderness.  Genitourinary: Rectum normal, prostate normal, testes/scrotum normal and penis normal.  Musculoskeletal: Normal range of motion. He exhibits no edema or tenderness.       Cervical back: Normal.       Thoracic back: Normal.       Lumbar back: Normal.  Lymphadenopathy:       Head (right side): No submental and no submandibular adenopathy present.       Head (left side): No submental and no submandibular adenopathy present.    He has no cervical adenopathy.  Neurological: He is alert and oriented to person, place, and time. He has normal motor skills, normal sensation, normal strength, normal reflexes and intact cranial nerves. No cranial nerve deficit.  Skin: Skin is warm, dry and intact.  No rash noted.  vitiligo  Psychiatric: Mood and affect normal.  Nursing note and vitals reviewed.     Assessment & Plan  Problem List Items Addressed This Visit    None    Visit Diagnoses    Annual physical exam    -  Primary   Relevant Orders   Lipid Profile   POCT occult blood stool (Completed)   Depression, unspecified depression type       Relevant Medications   sertraline (ZOLOFT) 50 MG tablet   Chronic bilateral low back pain with bilateral sciatica       tylenol   Relevant Medications   sertraline (ZOLOFT) 50 MG tablet   Other Relevant Orders   DG Lumbar Spine Complete   Hesitancy of micturition       Relevant Orders   PSA   Chronic diastolic congestive heart failure (HCC)       Relevant Medications   lisinopril (PRINIVIL,ZESTRIL) 20 MG tablet   hydrochlorothiazide (HYDRODIURIL) 25 MG tablet   Essential hypertension       Relevant Medications   lisinopril (PRINIVIL,ZESTRIL) 20 MG tablet   hydrochlorothiazide (HYDRODIURIL) 25 MG tablet   Other Relevant  Orders   Renal Function Panel   Class 3 severe obesity due to excess calories without serious comorbidity with body mass index (BMI) of 40.0 to 44.9 in adult [facility])       Relevant Orders   Lipid Profile   Colon cancer screening          Meds ordered this encounter  Medications  . lisinopril (PRINIVIL,ZESTRIL) 20 MG tablet    Sig: Take 1 tablet (20 mg total) by mouth daily.    Dispense:  90 tablet    Refill:  1  . hydrochlorothiazide (HYDRODIURIL) 25 MG tablet    Sig: Take 1 tablet (25 mg total) by mouth daily.    Dispense:  90 tablet    Refill:  1  . sertraline (ZOLOFT) 50 MG tablet    Sig: Take 1 tablet (50 mg total) by mouth daily. Start one half tablet q day for 10 days    Dispense:  30 tablet    Refill:  3      Dr. Macon [facility]  08/13/16

## 2016-08-14 LAB — LIPID PANEL
CHOLESTEROL TOTAL: 183 mg/dL (ref 100–199)
Chol/HDL Ratio: 6.1 ratio — ABNORMAL HIGH (ref 0.0–5.0)
HDL: 30 mg/dL — ABNORMAL LOW (ref >39)
LDL CALC: 98 mg/dL (ref 0–99)
Triglycerides: 274 mg/dL — ABNORMAL HIGH (ref 0–149)
VLDL Cholesterol Cal: 55 mg/dL — ABNORMAL HIGH (ref 5–40)

## 2016-08-14 LAB — RENAL FUNCTION PANEL
Albumin: 4.8 g/dL (ref 3.5–5.5)
BUN / CREAT RATIO: 12 (ref 9–20)
BUN: 20 mg/dL (ref 6–24)
CALCIUM: 10 mg/dL (ref 8.7–10.2)
CHLORIDE: 101 mmol/L (ref 96–106)
CO2: 23 mmol/L (ref 20–29)
Creatinine, Ser: 1.61 mg/dL — ABNORMAL HIGH (ref 0.76–1.27)
GFR calc Af Amer: 54 mL/min/{1.73_m2} — ABNORMAL LOW (ref >59)
GFR calc non Af Amer: 47 mL/min/{1.73_m2} — ABNORMAL LOW (ref >59)
GLUCOSE: 91 mg/dL (ref 65–99)
POTASSIUM: 4.1 mmol/L (ref 3.5–5.2)
Phosphorus: 3.5 mg/dL (ref 2.5–4.5)
SODIUM: 140 mmol/L (ref 134–144)

## 2016-08-14 LAB — PSA: Prostate Specific Ag, Serum: 3.3 ng/mL (ref 0.0–4.0)

## 2016-08-17 ENCOUNTER — Other Ambulatory Visit: Payer: Self-pay

## 2016-09-07 ENCOUNTER — Other Ambulatory Visit: Payer: Self-pay

## 2016-09-09 ENCOUNTER — Other Ambulatory Visit: Payer: BLUE CROSS/BLUE SHIELD

## 2016-09-09 DIAGNOSIS — R7989 Other specified abnormal findings of blood chemistry: Secondary | ICD-10-CM

## 2016-09-10 ENCOUNTER — Other Ambulatory Visit: Payer: Self-pay

## 2016-09-10 DIAGNOSIS — R7989 Other specified abnormal findings of blood chemistry: Secondary | ICD-10-CM

## 2016-09-10 LAB — CREATININE, SERUM
Creatinine, Ser: 1.6 mg/dL — ABNORMAL HIGH (ref 0.76–1.27)
GFR calc non Af Amer: 47 mL/min/{1.73_m2} — ABNORMAL LOW (ref >59)
GFR, EST AFRICAN AMERICAN: 54 mL/min/{1.73_m2} — AB (ref >59)

## 2016-09-24 ENCOUNTER — Encounter: Payer: Self-pay | Admitting: Family Medicine

## 2016-09-24 ENCOUNTER — Ambulatory Visit (INDEPENDENT_AMBULATORY_CARE_PROVIDER_SITE_OTHER): Payer: BLUE CROSS/BLUE SHIELD | Admitting: Family Medicine

## 2016-09-24 VITALS — BP 130/80 | HR 60 | Ht 71.0 in | Wt 277.0 lb

## 2016-09-24 DIAGNOSIS — I10 Essential (primary) hypertension: Secondary | ICD-10-CM | POA: Diagnosis not present

## 2016-09-24 DIAGNOSIS — N183 Chronic kidney disease, stage 3 unspecified: Secondary | ICD-10-CM | POA: Insufficient documentation

## 2016-09-24 DIAGNOSIS — Z862 Personal history of diseases of the blood and blood-forming organs and certain disorders involving the immune mechanism: Secondary | ICD-10-CM | POA: Diagnosis not present

## 2016-09-24 NOTE — Progress Notes (Signed)
Name: Alejandro Winters   MRN: [id number]    DOB: 08-19-1960   Date:09/24/2016       Progress Note  Subjective  Chief Complaint  Chief Complaint  Patient presents with  . Follow-up    creatinine level was 1.62- wanted him to come in to discuss meds/ whether or not to change something. Have made referral to Dr Holley Raring- checking on it now    Hypertension  This is a chronic problem. The current episode started more than 1 year ago. The problem has been gradually improving since onset. The problem is controlled. Pertinent negatives include no anxiety, blurred vision, chest pain, headaches, malaise/fatigue, neck pain, orthopnea, palpitations, peripheral edema, PND, shortness of breath or sweats. There are no associated agents to hypertension. The current treatment provides moderate improvement. There are no compliance problems.  There is no history of angina, kidney disease, CAD/MI, CVA, heart failure, left ventricular hypertrophy, PVD or retinopathy. There is no history of chronic renal disease, a hypertension causing med or renovascular disease.    No problem-specific Assessment & Plan notes found for this encounter.   Past Medical History:  Diagnosis Date  . CHF (congestive heart failure) ([place])   . Hypertension   . Sleep apnea   . Vitiligo     Past Surgical History:  Procedure Laterality Date  . COLONOSCOPY WITH PROPOFOL N/A 12/16/2014   Procedure: COLONOSCOPY WITH PROPOFOL;  Surgeon: Josefine Class, MD;  Location: [facility] ENDOSCOPY;  Service: Endoscopy;  Laterality: N/A;    Family History  Problem Relation Age of Onset  . Hypertension Mother   . Cancer Father   . Hypertension Father     Social History   Social History  . Marital status: Married    Spouse name: N/A  . Number of children: N/A  . Years of education: N/A   Occupational History  . Not on file.   Social History Main Topics  . Smoking status: Former Smoker    Packs/day: 0.50    Years: 1.00    Types:  Cigarettes    Quit date: 10/28/1998  . Smokeless tobacco: Never Used  . Alcohol use 0.0 oz/week  . Drug use: Yes    Types: Marijuana     Comment: marijuana  . Sexual activity: Not Currently   Other Topics Concern  . Not on file   Social History Narrative  . No narrative on file    No Known Allergies  Outpatient Medications Prior to Visit  Medication Sig Dispense Refill  . aspirin EC 81 MG tablet Take 81 mg by mouth daily.    . hydrochlorothiazide (HYDRODIURIL) 25 MG tablet Take 1 tablet (25 mg total) by mouth daily. 90 tablet 1  . lisinopril (PRINIVIL,ZESTRIL) 20 MG tablet Take 1 tablet (20 mg total) by mouth daily. 90 tablet 1  . sertraline (ZOLOFT) 50 MG tablet Take 1 tablet (50 mg total) by mouth daily. Start one half tablet q day for 10 days 30 tablet 3  . co-enzyme Q-10 30 MG capsule Take 200 mg by mouth daily.     . Fish Oil-Krill Oil (KRILL OIL PLUS PO) Take 1 capsule by mouth daily. 1250 mg- 2 tabs daily     No facility-administered medications prior to visit.     Review of Systems  Constitutional: Negative for chills, fever, malaise/fatigue and weight loss.  HENT: Negative for ear discharge, ear pain and sore throat.   Eyes: Negative for blurred vision.  Respiratory: Negative for cough, sputum production, shortness of  breath and wheezing.   Cardiovascular: Negative for chest pain, palpitations, orthopnea, leg swelling and PND.  Gastrointestinal: Negative for abdominal pain, blood in stool, constipation, diarrhea, heartburn, melena and nausea.  Genitourinary: Negative for dysuria, frequency, hematuria and urgency.  Musculoskeletal: Negative for back pain, joint pain, myalgias and neck pain.  Skin: Negative for rash.  Neurological: Negative for dizziness, tingling, sensory change, focal weakness and headaches.  Endo/Heme/Allergies: Negative for environmental allergies and polydipsia. Does not bruise/bleed easily.  Psychiatric/Behavioral: Negative for depression and  suicidal ideas. The patient is not nervous/anxious and does not have insomnia.      Objective  Vitals:   09/24/16 0906  BP: 130/80  Pulse: 60  Weight: 277 lb (125.6 kg)  Height: 5\' 11"  (1.803 m)    Physical Exam  Constitutional: He is oriented to person, place, and time and well-developed, well-nourished, and in no distress.  HENT:  Head: Normocephalic.  Right Ear: External ear normal.  Left Ear: External ear normal.  Nose: Nose normal.  Mouth/Throat: Oropharynx is clear and moist.  Eyes: Pupils are equal, round, and reactive to light. Conjunctivae and EOM are normal. Right eye exhibits no discharge. Left eye exhibits no discharge. No scleral icterus.  Neck: Normal range of motion. Neck supple. No JVD present. No tracheal deviation present. No thyromegaly present.  Cardiovascular: Normal rate, regular rhythm, normal heart sounds and intact distal pulses.  Exam reveals no gallop and no friction rub.   No murmur heard. Pulmonary/Chest: Breath sounds normal. No respiratory distress. He has no wheezes. He has no rales.  Abdominal: Soft. Bowel sounds are normal. He exhibits no mass. There is no hepatosplenomegaly. There is no tenderness. There is no rebound, no guarding and no CVA tenderness.  Musculoskeletal: Normal range of motion. He exhibits no edema or tenderness.  Lymphadenopathy:    He has no cervical adenopathy.  Neurological: He is alert and oriented to person, place, and time. He has normal sensation, normal strength, normal reflexes and intact cranial nerves. No cranial nerve deficit.  Skin: Skin is warm. No rash noted.  Psychiatric: Mood and affect normal.  Nursing note and vitals reviewed.     Assessment & Plan  Problem List Items Addressed This Visit      Cardiovascular and Mediastinum   Essential hypertension     Genitourinary   CKD (chronic kidney disease) stage 3, GFR 30-59 ml/min - Primary    Other Visit Diagnoses    History of anemia       Relevant  Orders   CBC with Differential/Platelet      No orders of the defined types were placed in this encounter.     Dr. Macon [facility]  09/24/16

## 2016-09-25 LAB — CBC WITH DIFFERENTIAL/PLATELET
BASOS ABS: 0 10*3/uL (ref 0.0–0.2)
Basos: 0 %
EOS (ABSOLUTE): 0 10*3/uL (ref 0.0–0.4)
Eos: 1 %
HEMOGLOBIN: 12.5 g/dL — AB (ref 13.0–17.7)
Hematocrit: 38.2 % (ref 37.5–51.0)
IMMATURE GRANS (ABS): 0 10*3/uL (ref 0.0–0.1)
IMMATURE GRANULOCYTES: 0 %
LYMPHS ABS: 1.1 10*3/uL (ref 0.7–3.1)
LYMPHS: 16 %
MCH: 32.1 pg (ref 26.6–33.0)
MCHC: 32.7 g/dL (ref 31.5–35.7)
MCV: 98 fL — ABNORMAL HIGH (ref 79–97)
MONOCYTES: 9 %
Monocytes Absolute: 0.6 10*3/uL (ref 0.1–0.9)
NEUTROS PCT: 74 %
Neutrophils Absolute: 4.9 10*3/uL (ref 1.4–7.0)
Platelets: 257 10*3/uL (ref 150–379)
RBC: 3.9 x10E6/uL — AB (ref 4.14–5.80)
RDW: 13 % (ref 12.3–15.4)
WBC: 6.6 10*3/uL (ref 3.4–10.8)

## 2016-12-16 ENCOUNTER — Other Ambulatory Visit: Payer: Self-pay | Admitting: Family Medicine

## 2016-12-30 ENCOUNTER — Other Ambulatory Visit: Payer: Self-pay

## 2016-12-30 DIAGNOSIS — R300 Dysuria: Secondary | ICD-10-CM

## 2016-12-31 ENCOUNTER — Ambulatory Visit (INDEPENDENT_AMBULATORY_CARE_PROVIDER_SITE_OTHER): Payer: BLUE CROSS/BLUE SHIELD | Admitting: Urology

## 2016-12-31 ENCOUNTER — Encounter: Payer: Self-pay | Admitting: Urology

## 2016-12-31 ENCOUNTER — Other Ambulatory Visit
Admission: RE | Admit: 2016-12-31 | Discharge: 2016-12-31 | Disposition: A | Discharge status: Home / Self Care | Payer: BLUE CROSS/BLUE SHIELD | Source: Ambulatory Visit | Attending: Urology | Admitting: Urology

## 2016-12-31 VITALS — BP 138/78 | HR 67 | Ht 71.0 in | Wt 275.0 lb

## 2016-12-31 DIAGNOSIS — N401 Enlarged prostate with lower urinary tract symptoms: Secondary | ICD-10-CM

## 2016-12-31 DIAGNOSIS — R300 Dysuria: Secondary | ICD-10-CM | POA: Insufficient documentation

## 2016-12-31 DIAGNOSIS — R972 Elevated prostate specific antigen [PSA]: Secondary | ICD-10-CM

## 2016-12-31 DIAGNOSIS — N138 Other obstructive and reflux uropathy: Secondary | ICD-10-CM | POA: Diagnosis not present

## 2016-12-31 DIAGNOSIS — R350 Frequency of micturition: Secondary | ICD-10-CM | POA: Diagnosis not present

## 2016-12-31 LAB — URINALYSIS, COMPLETE (UACMP) WITH MICROSCOPIC
BACTERIA UA: NONE SEEN
BILIRUBIN URINE: NEGATIVE
GLUCOSE, UA: NEGATIVE mg/dL
KETONES UR: NEGATIVE mg/dL
LEUKOCYTES UA: NEGATIVE
Nitrite: NEGATIVE
PROTEIN: NEGATIVE mg/dL
SQUAMOUS EPITHELIAL / LPF: NONE SEEN
Specific Gravity, Urine: 1.015 (ref 1.005–1.030)
WBC UA: NONE SEEN WBC/hpf (ref 0–5)
pH: 5.5 (ref 5.0–8.0)

## 2016-12-31 LAB — BLADDER SCAN AMB NON-IMAGING

## 2016-12-31 MED ORDER — TAMSULOSIN HCL 0.4 MG PO CAPS: 0.4000 mg | ORAL_CAPSULE | Freq: Every day | ORAL | 3 refills | Status: DC

## 2016-12-31 NOTE — Progress Notes (Signed)
12/31/2016 4:31 PM   [name] May 18, 1960 [id number]  Referring provider: Juline Patch, MD [address] [place], [place] [address]  Chief Complaint  Patient presents with  . Benign Prostatic Hypertrophy    New Patient    HPI: 56 yo M referred by Dr. Holley Raring for further evalatuion of urinary symptoms/ enlarged prostate.  The patient reports that he underwent an ultrasound in the office which showed an enlarged prostate on imaging.  The calculated prostate volume was 41 cc per the record.  As such, he was referred to urology for further evaluation.   He does have have baseline obstructive voiding symptoms including urinary hesitancy, weak stream, and occasional sensation of incomplete bladder emptying.  He is never taken any medications for this.  IPSS as below.  No history of bladder stones or UTIs.  Most recent PSA 3.3 on 08/13/16.    PVR 19 cc.    He does have baseline CKD.  Most recent creatinine 1.6.  He reports that this is felt to be related to underlying medical comorbidities including blood pressure, obesity, etc.    IPSS    Row Name 12/31/16 1400         International Prostate Symptom Score   How often have you had the sensation of not emptying your bladder?  About half the time     How often have you had to urinate less than every two hours?  Less than half the time     How often have you found you stopped and started again several times when you urinated?  About half the time     How often have you found it difficult to postpone urination?  Not at All     How often have you had a weak urinary stream?  Less than half the time     How often have you had to strain to start urination?  About half the time     How many times did you typically get up at night to urinate?  2 Times     Total IPSS Score  15       Quality of Life due to urinary symptoms   If you were to spend the rest of your life with your urinary condition just the way it is now how  would you feel about that?  Mixed        Score:  1-7 Mild 8-19 Moderate 20-35 Severe   PMH: Past Medical History:  Diagnosis Date  . CHF (congestive heart failure) ([place])   . Depression   . Hyperlipemia   . Hypertension   . Kidney failure   . Sleep apnea   . Vitiligo     Surgical History: No past surgical history on file.  Home Medications:  Allergies as of 12/31/2016   No Known Allergies     Medication List        Accurate as of 12/31/16 11:59 PM. Always use your most recent med list.          aspirin EC 81 MG tablet Take 81 mg by mouth daily.   co-enzyme Q-10 30 MG capsule Take 200 mg by mouth daily.   hydrochlorothiazide 25 MG tablet Commonly known as:  HYDRODIURIL Take 1 tablet (25 mg total) by mouth daily.   KRILL OIL PLUS PO Take 1 capsule by mouth daily. 1250 mg- 2 tabs daily   lisinopril 20 MG tablet Commonly known as:  PRINIVIL,ZESTRIL Take 1 tablet (20 mg  total) by mouth daily.   sertraline 50 MG tablet Commonly known as:  ZOLOFT TAKE 1/2 TABLET EVERY DAY FOR 10 DAYS THEN TAKE 1 TABLET DAILY   tamsulosin 0.4 MG Caps capsule Commonly known as:  FLOMAX Take 1 capsule (0.4 mg total) by mouth daily.       Allergies: No Known Allergies  Family History: Family History  Problem Relation Age of Onset  . Hypertension Mother   . Cancer Father   . Hypertension Father   . Prostate cancer Neg Hx   . Bladder Cancer Neg Hx   . Kidney cancer Neg Hx     Social History:  reports that he quit smoking about 18 years ago. His smoking use included cigarettes. He has a 0.50 pack-year smoking history. he has never used smokeless tobacco. He reports that he drinks alcohol. He reports that he uses drugs. Drug: Marijuana.  ROS: 12 point review of systems is negative other than as per HPI.  Physical Exam: BP 138/78   Pulse 67   Ht 5\' 11"  (1.803 m)   Wt 275 lb (124.7 kg)   BMI 38.35 kg/m   Constitutional:  Alert and oriented, No acute  distress. HEENT: [place] AT, moist mucus membranes.  Trachea midline, no masses. Cardiovascular: No clubbing, cyanosis, or edema. Respiratory: Normal respiratory effort, no increased work of breathing. GI: Abdomen is soft, nontender, nondistended, no abdominal masses GU: No CVA tenderness.  Rectal: Normal sphincter tone.  40 cc prostate, nontender, rubbery, no nodules Skin: Extensive vitiligo appreciated, scalp scarring noted. Neurologic: Grossly intact, no focal deficits, moving all 4 extremities. Psychiatric: Normal mood and affect.  Laboratory Data: Lab Results  Component Value Date   WBC 6.6 09/24/2016   HGB 12.5 (L) 09/24/2016   HCT 38.2 09/24/2016   MCV 98 (H) 09/24/2016   PLT 257 09/24/2016    Lab Results  Component Value Date   CREATININE 1.60 (H) 09/09/2016    Lab Results  Component Value Date   PSA1 3.3 08/13/2016   UA Component     Latest Ref Rng & Units 12/31/2016  Color, Urine     YELLOW YELLOW  Appearance     CLEAR CLEAR  Specific Gravity, Urine     1.005 - 1.030 1.015  pH     5.0 - 8.0 5.5  Glucose     NEGATIVE mg/dL NEGATIVE  Hgb urine dipstick     NEGATIVE TRACE (A)  Bilirubin Urine     NEGATIVE NEGATIVE  Ketones, ur     NEGATIVE mg/dL NEGATIVE  Protein     NEGATIVE mg/dL NEGATIVE  Nitrite     NEGATIVE NEGATIVE  Leukocytes, UA     NEGATIVE NEGATIVE  Squamous Epithelial / LPF     NONE SEEN NONE SEEN  WBC, UA     0 - 5 WBC/hpf NONE SEEN  RBC / HPF     0 - 5 RBC/hpf 0-5  Bacteria, UA     NONE SEEN NONE SEEN  Mucus      PRESENT    Pertinent Imaging:   Assessment & Plan:    1. Benign prostatic hyperplasia with urinary obstruction Bothersome obstructive voiding symptoms Discussed initiation of Flomax therapy with reassessment in 6 months-side effects reviewed Adequate bladder emptying - BLADDER SCAN AMB NON-IMAGING  2. Elevated PSA PSA mildly elevated for age, will follow with repeat in 6 months Rectal exam reassuring  - BLADDER  SCAN AMB NON-IMAGING   Return in about 6 months (around 06/30/2017)  for PSA/ IPSS/ PVR.  Hollice Espy, MD  [facility] [address] [place], [place] [address] [phone]

## 2017-01-17 ENCOUNTER — Other Ambulatory Visit: Payer: Self-pay | Admitting: Family Medicine

## 2017-02-23 ENCOUNTER — Other Ambulatory Visit: Payer: Self-pay | Admitting: Family Medicine

## 2017-04-06 ENCOUNTER — Other Ambulatory Visit: Payer: Self-pay | Admitting: Family Medicine

## 2017-04-17 ENCOUNTER — Other Ambulatory Visit: Payer: Self-pay | Admitting: Family Medicine

## 2017-04-17 DIAGNOSIS — I5032 Chronic diastolic (congestive) heart failure: Secondary | ICD-10-CM

## 2017-04-17 DIAGNOSIS — I10 Essential (primary) hypertension: Secondary | ICD-10-CM

## 2017-05-11 ENCOUNTER — Other Ambulatory Visit: Payer: Self-pay | Admitting: Family Medicine

## 2017-05-20 ENCOUNTER — Other Ambulatory Visit: Payer: Self-pay | Admitting: Family Medicine

## 2017-05-20 DIAGNOSIS — I10 Essential (primary) hypertension: Secondary | ICD-10-CM

## 2017-05-20 DIAGNOSIS — I5032 Chronic diastolic (congestive) heart failure: Secondary | ICD-10-CM

## 2017-06-17 ENCOUNTER — Other Ambulatory Visit: Payer: Self-pay | Admitting: Family Medicine

## 2017-06-17 DIAGNOSIS — I10 Essential (primary) hypertension: Secondary | ICD-10-CM

## 2017-06-17 DIAGNOSIS — I5032 Chronic diastolic (congestive) heart failure: Secondary | ICD-10-CM

## 2017-07-01 ENCOUNTER — Ambulatory Visit: Payer: BLUE CROSS/BLUE SHIELD | Admitting: Urology

## 2017-07-05 ENCOUNTER — Other Ambulatory Visit: Payer: Self-pay | Admitting: Family Medicine

## 2017-07-05 DIAGNOSIS — I10 Essential (primary) hypertension: Secondary | ICD-10-CM

## 2017-07-05 DIAGNOSIS — I5032 Chronic diastolic (congestive) heart failure: Secondary | ICD-10-CM

## 2017-07-21 ENCOUNTER — Other Ambulatory Visit: Payer: Self-pay | Admitting: Family Medicine

## 2017-07-21 DIAGNOSIS — I5032 Chronic diastolic (congestive) heart failure: Secondary | ICD-10-CM

## 2017-07-21 DIAGNOSIS — I10 Essential (primary) hypertension: Secondary | ICD-10-CM

## 2017-07-25 ENCOUNTER — Other Ambulatory Visit: Payer: Self-pay | Admitting: Family Medicine

## 2017-08-04 ENCOUNTER — Other Ambulatory Visit: Payer: Self-pay

## 2017-08-04 DIAGNOSIS — R972 Elevated prostate specific antigen [PSA]: Secondary | ICD-10-CM

## 2017-08-05 ENCOUNTER — Ambulatory Visit: Payer: BLUE CROSS/BLUE SHIELD | Admitting: Urology

## 2017-08-15 ENCOUNTER — Other Ambulatory Visit: Payer: Self-pay | Admitting: Family Medicine

## 2017-08-15 DIAGNOSIS — I10 Essential (primary) hypertension: Secondary | ICD-10-CM

## 2017-08-15 DIAGNOSIS — I5032 Chronic diastolic (congestive) heart failure: Secondary | ICD-10-CM

## 2017-08-22 ENCOUNTER — Encounter: Payer: Self-pay | Admitting: Family Medicine

## 2017-08-22 ENCOUNTER — Ambulatory Visit: Payer: BLUE CROSS/BLUE SHIELD | Admitting: Family Medicine

## 2017-08-22 VITALS — BP 138/80 | HR 80 | Ht 71.0 in | Wt 291.0 lb

## 2017-08-22 DIAGNOSIS — E782 Mixed hyperlipidemia: Secondary | ICD-10-CM

## 2017-08-22 DIAGNOSIS — E669 Obesity, unspecified: Secondary | ICD-10-CM

## 2017-08-22 DIAGNOSIS — I10 Essential (primary) hypertension: Secondary | ICD-10-CM

## 2017-08-22 DIAGNOSIS — E66812 Obesity, class 2: Secondary | ICD-10-CM

## 2017-08-22 DIAGNOSIS — M25552 Pain in left hip: Secondary | ICD-10-CM

## 2017-08-22 DIAGNOSIS — E8881 Metabolic syndrome: Secondary | ICD-10-CM

## 2017-08-22 DIAGNOSIS — Z1159 Encounter for screening for other viral diseases: Secondary | ICD-10-CM | POA: Diagnosis not present

## 2017-08-22 DIAGNOSIS — I5032 Chronic diastolic (congestive) heart failure: Secondary | ICD-10-CM

## 2017-08-22 DIAGNOSIS — M25551 Pain in right hip: Secondary | ICD-10-CM

## 2017-08-22 MED ORDER — SERTRALINE HCL 50 MG PO TABS: ORAL_TABLET | ORAL | 1 refills | Status: DC

## 2017-08-22 MED ORDER — LISINOPRIL 20 MG PO TABS: ORAL_TABLET | ORAL | 1 refills | Status: DC

## 2017-08-22 MED ORDER — HYDROCHLOROTHIAZIDE 25 MG PO TABS: 25.0000 mg | ORAL_TABLET | Freq: Every day | ORAL | 1 refills | Status: DC

## 2017-08-22 NOTE — Assessment & Plan Note (Deleted)
Stable on meds/ continue lisinopril 20mg /HCTZ 25 mg daily/ check renal panel.

## 2017-08-22 NOTE — Progress Notes (Addendum)
Name: Alejandro [place]   MRN: [id number]    DOB: 01/07/1961   Date:08/22/2017       Progress Note  Subjective  Chief Complaint  Chief Complaint  Patient presents with  . Depression  . Hypertension    Depression         The patient presents with no depression.  This is a chronic problem.  The current episode started more than 1 year ago.   The problem occurs intermittently.  The problem has been waxing and waning since onset.  Associated symptoms include no decreased concentration, no fatigue, no helplessness, no hopelessness, does not have insomnia, not irritable, no restlessness, no decreased interest, no appetite change, no body aches, no myalgias, no headaches, no indigestion, not sad and no suicidal ideas.     The symptoms are aggravated by work stress.  Past treatments include SSRIs - Selective serotonin reuptake inhibitors.  Compliance with treatment is good.  Previous treatment provided moderate relief.   Pertinent negatives include no anxiety and no depression. Hypertension  This is a chronic problem. The current episode started more than 1 year ago. The problem is unchanged. The problem is controlled. Pertinent negatives include no anxiety, blurred vision, chest pain, headaches, malaise/fatigue, neck pain, orthopnea, palpitations, peripheral edema, PND, shortness of breath or sweats. There are no associated agents to hypertension. There are no known risk factors for coronary artery disease. Past treatments include ACE inhibitors and diuretics. The current treatment provides moderate improvement. There are no compliance problems.  There is no history of angina, kidney disease, CAD/MI, CVA, heart failure, left ventricular hypertrophy, PVD or retinopathy. There is no history of chronic renal disease, a hypertension causing med or renovascular disease.  Hip Pain   The incident occurred more than 1 week ago (2 years). The incident occurred at the gym. There was no injury mechanism. The pain is  present in the left hip and right hip. The quality of the pain is described as aching. The pain is moderate. Pertinent negatives include no inability to bear weight, loss of motion, loss of sensation, muscle weakness, numbness or tingling. Nothing aggravates the symptoms. He has tried acetaminophen for the symptoms. The treatment provided mild relief.    Essential hypertension Controlled. Continue lisinopril 20mg /HCTZ 25mg  daily/ check renal panel  Mixed hyperlipidemia Previous abnormalities noted/currently diet controlled/will check lipid panel   Past Medical History:  Diagnosis Date  . CHF (congestive heart failure) ([place])   . Depression   . Hyperlipemia   . Hypertension   . Kidney failure   . Sleep apnea   . Vitiligo     Past Surgical History:  Procedure Laterality Date  . COLONOSCOPY WITH PROPOFOL N/A 12/16/2014   Procedure: COLONOSCOPY WITH PROPOFOL;  Surgeon: Josefine Class, MD;  Location: [facility] ENDOSCOPY;  Service: Endoscopy;  Laterality: N/A;    Family History  Problem Relation Age of Onset  . Hypertension Mother   . Cancer Father   . Hypertension Father   . Prostate cancer Neg Hx   . Bladder Cancer Neg Hx   . Kidney cancer Neg Hx     Social History   Socioeconomic History  . Marital status: Married    Spouse name: Not on file  . Number of children: Not on file  . Years of education: Not on file  . Highest education level: Not on file  Occupational History  . Not on file  Social Needs  . Financial resource strain: Not on file  . Food insecurity:  Worry: Not on file    Inability: Not on file  . Transportation needs:    Medical: Not on file    Non-medical: Not on file  Tobacco Use  . Smoking status: Former Smoker    Packs/day: 0.50    Years: 1.00    Pack years: 0.50    Types: Cigarettes    Last attempt to quit: 10/28/1998    Years since quitting: 18.8  . Smokeless tobacco: Never Used  Substance and Sexual Activity  . Alcohol use: Yes     Alcohol/week: 0.0 oz  . Drug use: Yes    Types: Marijuana    Comment: marijuana  . Sexual activity: Not Currently  Lifestyle  . Physical activity:    Days per week: Not on file    Minutes per session: Not on file  . Stress: Not on file  Relationships  . Social connections:    Talks on phone: Not on file    Gets together: Not on file    Attends religious service: Not on file    Active member of club or organization: Not on file    Attends meetings of clubs or organizations: Not on file    Relationship status: Not on file  . Intimate partner violence:    Fear of current or ex partner: Not on file    Emotionally abused: Not on file    Physically abused: Not on file    Forced sexual activity: Not on file  Other Topics Concern  . Not on file  Social History Narrative  . Not on file    No Known Allergies  Outpatient Medications Prior to Visit  Medication Sig Dispense Refill  . aspirin EC 81 MG tablet Take 81 mg by mouth daily.    . tamsulosin (FLOMAX) 0.4 MG CAPS capsule Take 1 capsule (0.4 mg total) by mouth daily. (Patient taking differently: Take 0.4 mg by mouth daily. Dr Erlene Quan prescribes) 90 capsule 3  . hydrochlorothiazide (HYDRODIURIL) 25 MG tablet TAKE 1 TABLET BY MOUTH DAILY 7 tablet 0  . lisinopril (PRINIVIL,ZESTRIL) 20 MG tablet TAKE 1 TABLET(20 MG) BY MOUTH DAILY 30 tablet 0  . sertraline (ZOLOFT) 50 MG tablet TAKE 1/2 TABLET BY MOUTH EVERY DAY FOR 10 DAYS THEN 1 TABLET DAILY THEREAFTER 7 tablet 0  . co-enzyme Q-10 30 MG capsule Take 200 mg by mouth daily.     . Fish Oil-Krill Oil (KRILL OIL PLUS PO) Take 1 capsule by mouth daily. 1250 mg- 2 tabs daily     No facility-administered medications prior to visit.     Review of Systems  Constitutional: Negative for appetite change, chills, fatigue, fever, malaise/fatigue and weight loss.  HENT: Negative for ear discharge, ear pain and sore throat.   Eyes: Negative for blurred vision.  Respiratory: Negative for cough,  sputum production, shortness of breath and wheezing.   Cardiovascular: Negative for chest pain, palpitations, orthopnea, leg swelling and PND.  Gastrointestinal: Negative for abdominal pain, blood in stool, constipation, diarrhea, heartburn, melena and nausea.  Genitourinary: Negative for dysuria, frequency, hematuria and urgency.  Musculoskeletal: Negative for back pain, joint pain, myalgias and neck pain.  Skin: Negative for rash.  Neurological: Negative for dizziness, tingling, sensory change, focal weakness, numbness and headaches.  Endo/Heme/Allergies: Negative for environmental allergies and polydipsia. Does not bruise/bleed easily.  Psychiatric/Behavioral: Positive for depression. Negative for decreased concentration and suicidal ideas. The patient is not nervous/anxious and does not have insomnia.      Objective  Vitals:  08/22/17 0917  BP: 138/80  Pulse: 80  Weight: 291 lb (132 kg)  Height: 5\' 11"  (1.803 m)    Physical Exam  Constitutional: He is oriented to person, place, and time. He is not irritable.  HENT:  Head: Normocephalic.  Right Ear: External ear normal.  Left Ear: External ear normal.  Nose: Nose normal.  Mouth/Throat: Oropharynx is clear and moist.  Eyes: Pupils are equal, round, and reactive to light. Conjunctivae and EOM are normal. Right eye exhibits no discharge. Left eye exhibits no discharge. No scleral icterus.  Neck: Normal range of motion. Neck supple. No JVD present. No tracheal deviation present. No thyromegaly present.  Cardiovascular: Normal rate, regular rhythm, normal heart sounds and intact distal pulses. Exam reveals no gallop and no friction rub.  No murmur heard. Pulmonary/Chest: Breath sounds normal. No respiratory distress. He has no wheezes. He has no rales.  Abdominal: Soft. Bowel sounds are normal. He exhibits no mass. There is no hepatosplenomegaly. There is no tenderness. There is no rebound, no guarding and no CVA tenderness.   Musculoskeletal: Normal range of motion. He exhibits no edema or tenderness.  Lymphadenopathy:    He has no cervical adenopathy.  Neurological: He is alert and oriented to person, place, and time. He has normal strength and normal reflexes. No cranial nerve deficit.  Skin: Skin is warm. No rash noted.  Nursing note and vitals reviewed.     Assessment & Plan  Problem List Items Addressed This Visit      Cardiovascular and Mediastinum   Essential hypertension - Primary    Controlled. Continue lisinopril 20mg /HCTZ 25mg  daily/ check renal panel      Relevant Medications   lisinopril (PRINIVIL,ZESTRIL) 20 MG tablet   hydrochlorothiazide (HYDRODIURIL) 25 MG tablet   Other Relevant Orders   Hemoglobin A1c     Other   Mixed hyperlipidemia    Previous abnormalities noted/currently diet controlled/will check lipid panel      Relevant Medications   lisinopril (PRINIVIL,ZESTRIL) 20 MG tablet   hydrochlorothiazide (HYDRODIURIL) 25 MG tablet   Other Relevant Orders   Hemoglobin A1c   Lipid panel    Other Visit Diagnoses    Chronic diastolic congestive heart failure (HCC)       stable on meds   Relevant Medications   lisinopril (PRINIVIL,ZESTRIL) 20 MG tablet   hydrochlorothiazide (HYDRODIURIL) 25 MG tablet   Other Relevant Orders   Renal Function Panel   Metabolic syndrome       draw glucose level and A1C   Relevant Orders   Hemoglobin A1c   Obesity, Class II, BMI 35-39.9       discussed weight loss   Relevant Orders   Hemoglobin A1c   Lipid panel   Pain of both hip joints       Likely muscular /suggest tylennol   Need for hepatitis C screening test       ordered hep c screening   Relevant Orders   Hepatitis C antibody      Meds ordered this encounter  Medications  . lisinopril (PRINIVIL,ZESTRIL) 20 MG tablet    Sig: TAKE 1 TABLET(20 MG) BY MOUTH DAILY    Dispense:  90 tablet    Refill:  1  . hydrochlorothiazide (HYDRODIURIL) 25 MG tablet    Sig: Take 1  tablet (25 mg total) by mouth daily.    Dispense:  90 tablet    Refill:  1    Last time filling  . sertraline (ZOLOFT) 50  MG tablet    Sig: TAKE 1/2 TABLET BY MOUTH EVERY DAY FOR 10 DAYS THEN 1 TABLET DAILY THEREAFTER    Dispense:  90 tablet    Refill:  1    Last time filling      Dr. Macon [facility]  08/22/17

## 2017-08-22 NOTE — Assessment & Plan Note (Signed)
Previous abnormalities noted/currently diet controlled/will check lipid panel

## 2017-08-22 NOTE — Assessment & Plan Note (Signed)
Controlled. Continue lisinopril 20mg /HCTZ 25mg  daily/ check renal panel

## 2017-08-23 LAB — LIPID PANEL
Chol/HDL Ratio: 6.4 ratio — ABNORMAL HIGH (ref 0.0–5.0)
Cholesterol, Total: 179 mg/dL (ref 100–199)
HDL: 28 mg/dL — ABNORMAL LOW (ref >39)
LDL Calculated: 100 mg/dL — ABNORMAL HIGH (ref 0–99)
Triglycerides: 256 mg/dL — ABNORMAL HIGH (ref 0–149)
VLDL CHOLESTEROL CAL: 51 mg/dL — AB (ref 5–40)

## 2017-08-23 LAB — HEMOGLOBIN A1C
ESTIMATED AVERAGE GLUCOSE: 88 mg/dL
Hgb A1c MFr Bld: 4.7 % — ABNORMAL LOW (ref 4.8–5.6)

## 2017-08-23 LAB — RENAL FUNCTION PANEL
ALBUMIN: 4.9 g/dL (ref 3.5–5.5)
BUN/Creatinine Ratio: 11 (ref 9–20)
BUN: 16 mg/dL (ref 6–24)
CALCIUM: 9.6 mg/dL (ref 8.7–10.2)
CHLORIDE: 107 mmol/L — AB (ref 96–106)
CO2: 22 mmol/L (ref 20–29)
Creatinine, Ser: 1.44 mg/dL — ABNORMAL HIGH (ref 0.76–1.27)
GFR calc non Af Amer: 53 mL/min/{1.73_m2} — ABNORMAL LOW (ref >59)
GFR, EST AFRICAN AMERICAN: 61 mL/min/{1.73_m2} (ref >59)
Glucose: 92 mg/dL (ref 65–99)
POTASSIUM: 4.3 mmol/L (ref 3.5–5.2)
Phosphorus: 3.3 mg/dL (ref 2.5–4.5)
Sodium: 143 mmol/L (ref 134–144)

## 2017-08-23 LAB — HEPATITIS C ANTIBODY: Hep C Virus Ab: <0.1 s/co ratio (ref 0.0–0.9)

## 2017-09-23 ENCOUNTER — Ambulatory Visit: Payer: BLUE CROSS/BLUE SHIELD | Admitting: Urology

## 2017-10-21 ENCOUNTER — Encounter: Payer: Self-pay | Admitting: Urology

## 2017-10-21 ENCOUNTER — Ambulatory Visit (INDEPENDENT_AMBULATORY_CARE_PROVIDER_SITE_OTHER): Payer: BLUE CROSS/BLUE SHIELD | Admitting: Urology

## 2017-10-21 ENCOUNTER — Other Ambulatory Visit
Admission: RE | Admit: 2017-10-21 | Discharge: 2017-10-21 | Disposition: A | Discharge status: Home / Self Care | Payer: BLUE CROSS/BLUE SHIELD | Source: Ambulatory Visit | Attending: Urology | Admitting: Urology

## 2017-10-21 VITALS — BP 132/87 | HR 69 | Ht 71.0 in | Wt 280.0 lb

## 2017-10-21 DIAGNOSIS — N401 Enlarged prostate with lower urinary tract symptoms: Secondary | ICD-10-CM

## 2017-10-21 DIAGNOSIS — R972 Elevated prostate specific antigen [PSA]: Secondary | ICD-10-CM | POA: Insufficient documentation

## 2017-10-21 DIAGNOSIS — N138 Other obstructive and reflux uropathy: Secondary | ICD-10-CM

## 2017-10-21 LAB — BLADDER SCAN AMB NON-IMAGING

## 2017-10-21 NOTE — Progress Notes (Signed)
10/21/2017 2:47 PM   Alejandro Winters 27-Jul-1960 [id number]  Referring provider: Juline Patch, MD [address] [place], [place] [address]  Chief Complaint  Patient presents with  . Benign Prostatic Hypertrophy    71month    HPI: 57 year old male with BPH and obstructive urinary symptoms he returns today for follow-up.  At last visit, he was started on Flomax for voiding symptoms which included urinary hesitancy, weak stream, and occasional sensation of incomplete bladder emptying.  Today, he is doing very well.  He reports that after starting the Flomax, he had significantly decreased urinary symptoms including improved stream and bladder emptying.  He is overall very pleased.  IPSS as below.  Repeat PSA today is pending.  His most recent PSA was 3.3 on 618.  He does have baseline CKD, followed by Dr. Holley Raring.  Baseline creatinine 1.6.  IPSS    Row Name 10/21/17 1400         International Prostate Symptom Score   How often have you had the sensation of not emptying your bladder?  Less than 1 in 5     How often have you had to urinate less than every two hours?  Less than 1 in 5 times     How often have you found you stopped and started again several times when you urinated?  Less than half the time     How often have you found it difficult to postpone urination?  Not at All     How often have you had a weak urinary stream?  Less than 1 in 5 times     How often have you had to strain to start urination?  Less than 1 in 5 times     How many times did you typically get up at night to urinate?  1 Time     Total IPSS Score  7       Quality of Life due to urinary symptoms   If you were to spend the rest of your life with your urinary condition just the way it is now how would you feel about that?  Mostly Satisfied        Score:  1-7 Mild 8-19 Moderate 20-35 Severe   PMH: Past Medical History:  Diagnosis Date  . CHF (congestive heart failure) ([place])   .  Depression   . Hyperlipemia   . Hypertension   . Kidney failure   . Sleep apnea   . Vitiligo     Surgical History: Past Surgical History:  Procedure Laterality Date  . COLONOSCOPY WITH PROPOFOL N/A 12/16/2014   Procedure: COLONOSCOPY WITH PROPOFOL;  Surgeon: Josefine Class, MD;  Location: [facility] ENDOSCOPY;  Service: Endoscopy;  Laterality: N/A;    Home Medications:  Allergies as of 10/21/2017   No Known Allergies     Medication List        Accurate as of 10/21/17  2:47 PM. Always use your most recent med list.          hydrochlorothiazide 25 MG tablet Commonly known as:  HYDRODIURIL Take 1 tablet (25 mg total) by mouth daily.   lisinopril 20 MG tablet Commonly known as:  PRINIVIL,ZESTRIL TAKE 1 TABLET(20 MG) BY MOUTH DAILY   sertraline 50 MG tablet Commonly known as:  ZOLOFT TAKE 1/2 TABLET BY MOUTH EVERY DAY FOR 10 DAYS THEN 1 TABLET DAILY THEREAFTER   tamsulosin 0.4 MG Caps capsule Commonly known as:  FLOMAX Take 1 capsule (0.4 mg total)  by mouth daily.       Allergies: No Known Allergies  Family History: Family History  Problem Relation Age of Onset  . Hypertension Mother   . Cancer Father   . Hypertension Father   . Prostate cancer Neg Hx   . Bladder Cancer Neg Hx   . Kidney cancer Neg Hx     Social History:  reports that he quit smoking about 18 years ago. His smoking use included cigarettes. He has a 0.50 pack-year smoking history. He has never used smokeless tobacco. He reports that he drinks alcohol. He reports that he has current or past drug history. Drug: Marijuana.  ROS: UROLOGY Frequent Urination?: No Hard to postpone urination?: No Burning/pain with urination?: No Get up at night to urinate?: No Leakage of urine?: No Urine stream starts and stops?: No Trouble starting stream?: No Do you have to strain to urinate?: No Blood in urine?: No Urinary tract infection?: No Sexually transmitted disease?: No Injury to kidneys or  bladder?: No Painful intercourse?: No Weak stream?: No Erection problems?: No Penile pain?: No  Gastrointestinal Nausea?: No Vomiting?: No Indigestion/heartburn?: No Diarrhea?: No Constipation?: No  Constitutional Fever: No Night sweats?: No Weight loss?: No Fatigue?: No  Skin Skin rash/lesions?: No Itching?: No  Eyes Blurred vision?: No Double vision?: No  Ears/Nose/Throat Sore throat?: No Sinus problems?: No  Hematologic/Lymphatic Swollen glands?: No Easy bruising?: No  Cardiovascular Leg swelling?: No Chest pain?: No  Respiratory Cough?: No Shortness of breath?: No  Endocrine Excessive thirst?: No  Musculoskeletal Back pain?: No Joint pain?: No  Neurological Headaches?: No Dizziness?: No  Psychologic Depression?: No Anxiety?: No  Physical Exam: BP 132/87   Pulse 69   Ht 5\' 11"  (1.803 m)   Wt 280 lb (127 kg)   BMI 39.05 kg/m   Constitutional:  Alert and oriented, No acute distress. HEENT: [place] AT, moist mucus membranes.  Trachea midline, no masses. Cardiovascular: No clubbing, cyanosis, or edema. Respiratory: Normal respiratory effort, no increased work of breathing. Skin: No rashes, bruises or suspicious lesions. Neurologic: Grossly intact, no focal deficits, moving all 4 extremities. Psychiatric: Normal mood and affect.  Laboratory Data: Lab Results  Component Value Date   WBC 6.6 09/24/2016   HGB 12.5 (L) 09/24/2016   HCT 38.2 09/24/2016   MCV 98 (H) 09/24/2016   PLT 257 09/24/2016    Lab Results  Component Value Date   CREATININE 1.44 (H) 08/22/2017    PSA today is pending  Lab Results  Component Value Date   HGBA1C 4.7 (L) 08/22/2017    Urinalysis N/a  Pertinent Imaging: Results for orders placed or performed in visit on 10/21/17  BLADDER SCAN AMB NON-IMAGING  Result Value Ref Range   Scan Result 88ml     Assessment & Plan:    1. Benign prostatic hyperplasia with urinary obstruction Urinary symptoms  significantly improved on Flomax We will continue this medication Adequate bladder emptying today We will continue to monitor urinary symptoms - BLADDER SCAN AMB NON-IMAGING  2. Elevated PSA Slightly elevated PSA for his age Repeat today pending We will call with results Continue to monitor    Return in about 1 year (around 10/22/2018) for IPSS/ PVR/ PSA/ DRE.  Hollice Espy, MD  [facility] [address] [place], [place] [address] [phone]

## 2017-10-22 LAB — PSA: Prostatic Specific Antigen: 3.52 ng/mL (ref 0.00–4.00)

## 2017-11-18 ENCOUNTER — Other Ambulatory Visit: Payer: Self-pay | Admitting: Family Medicine

## 2017-11-18 DIAGNOSIS — I10 Essential (primary) hypertension: Secondary | ICD-10-CM

## 2017-11-18 DIAGNOSIS — I5032 Chronic diastolic (congestive) heart failure: Secondary | ICD-10-CM

## 2018-03-13 ENCOUNTER — Other Ambulatory Visit: Payer: Self-pay | Admitting: Family Medicine

## 2018-03-13 DIAGNOSIS — I5032 Chronic diastolic (congestive) heart failure: Secondary | ICD-10-CM

## 2018-03-13 DIAGNOSIS — I10 Essential (primary) hypertension: Secondary | ICD-10-CM

## 2018-04-03 ENCOUNTER — Other Ambulatory Visit: Payer: Self-pay | Admitting: Urology

## 2018-04-25 ENCOUNTER — Other Ambulatory Visit: Payer: Self-pay | Admitting: Family Medicine

## 2018-04-25 DIAGNOSIS — I5032 Chronic diastolic (congestive) heart failure: Secondary | ICD-10-CM

## 2018-04-25 DIAGNOSIS — I10 Essential (primary) hypertension: Secondary | ICD-10-CM

## 2018-04-26 ENCOUNTER — Ambulatory Visit (INDEPENDENT_AMBULATORY_CARE_PROVIDER_SITE_OTHER): Payer: BLUE CROSS/BLUE SHIELD | Admitting: Family Medicine

## 2018-04-26 ENCOUNTER — Encounter: Payer: Self-pay | Admitting: Family Medicine

## 2018-04-26 VITALS — BP 124/70 | HR 68 | Ht 71.0 in | Wt 300.0 lb

## 2018-04-26 DIAGNOSIS — I5032 Chronic diastolic (congestive) heart failure: Secondary | ICD-10-CM

## 2018-04-26 DIAGNOSIS — F324 Major depressive disorder, single episode, in partial remission: Secondary | ICD-10-CM | POA: Diagnosis not present

## 2018-04-26 DIAGNOSIS — Z6841 Body Mass Index (BMI) 40.0 and over, adult: Secondary | ICD-10-CM

## 2018-04-26 DIAGNOSIS — I10 Essential (primary) hypertension: Secondary | ICD-10-CM | POA: Diagnosis not present

## 2018-04-26 DIAGNOSIS — E782 Mixed hyperlipidemia: Secondary | ICD-10-CM

## 2018-04-26 MED ORDER — LISINOPRIL 20 MG PO TABS: ORAL_TABLET | ORAL | 1 refills | Status: DC

## 2018-04-26 MED ORDER — HYDROCHLOROTHIAZIDE 25 MG PO TABS: 25.0000 mg | ORAL_TABLET | Freq: Every day | ORAL | 1 refills | Status: DC

## 2018-04-26 MED ORDER — SERTRALINE HCL 50 MG PO TABS: ORAL_TABLET | ORAL | 1 refills | Status: DC

## 2018-04-26 NOTE — Progress Notes (Signed)
Date:  04/26/2018   Name:  Alejandro Winters   DOB:  10/17/1960   MRN:  [id number]   Chief Complaint: Depression (PHQ9=3) and Hypertension  Depression       The patient presents with depression.  This is a chronic problem.  The current episode started more than 1 year ago.   The onset quality is sudden.   The problem occurs intermittently.  The problem has been gradually improving since onset.  Associated symptoms include no decreased concentration, no fatigue, no helplessness, no hopelessness, does not have insomnia, not irritable, no restlessness, no decreased interest, no appetite change, no body aches, no myalgias, no headaches, no indigestion, not sad and no suicidal ideas.  Past treatments include SSRIs - Selective serotonin reuptake inhibitors.  Compliance with treatment is good.  Previous treatment provided moderate relief.  Past medical history includes depression.     Pertinent negatives include no anxiety. Hypertension  This is a chronic problem. The current episode started more than 1 year ago. The problem is unchanged. The problem is controlled. Pertinent negatives include no anxiety, blurred vision, chest pain, headaches, malaise/fatigue, neck pain, orthopnea, palpitations, peripheral edema, PND, shortness of breath or sweats. There are no associated agents to hypertension. Risk factors for coronary artery disease include dyslipidemia and male gender. Past treatments include ACE inhibitors and diuretics. The current treatment provides moderate improvement. There are no compliance problems.  There is no history of angina, kidney disease, CAD/MI, CVA, heart failure, left ventricular hypertrophy, PVD or retinopathy. There is no history of chronic renal disease, a hypertension causing med or renovascular disease.    Review of Systems  Constitutional: Negative for appetite change, chills, fatigue, fever and malaise/fatigue.  HENT: Negative for drooling, ear discharge, ear pain and sore throat.    Eyes: Negative for blurred vision.  Respiratory: Negative for cough, shortness of breath and wheezing.   Cardiovascular: Negative for chest pain, palpitations, orthopnea, leg swelling and PND.  Gastrointestinal: Negative for abdominal pain, blood in stool, constipation, diarrhea and nausea.  Endocrine: Negative for polydipsia.  Genitourinary: Negative for dysuria, frequency, hematuria and urgency.  Musculoskeletal: Negative for back pain, myalgias and neck pain.  Skin: Negative for rash.  Allergic/Immunologic: Negative for environmental allergies.  Neurological: Negative for dizziness and headaches.  Hematological: Does not bruise/bleed easily.  Psychiatric/Behavioral: Positive for depression. Negative for decreased concentration and suicidal ideas. The patient is not nervous/anxious and does not have insomnia.     Patient Active Problem List   Diagnosis Date Noted  . Mixed hyperlipidemia 08/22/2017  . Essential hypertension 09/24/2016  . CKD (chronic kidney disease) stage 3, GFR 30-59 ml/min (HCC) 09/24/2016    No Known Allergies  Past Surgical History:  Procedure Laterality Date  . COLONOSCOPY WITH PROPOFOL N/A 12/16/2014   Procedure: COLONOSCOPY WITH PROPOFOL;  Surgeon: Josefine Class, MD;  Location[facility] ENDOSCOPY;  Service: Endoscopy;  Laterality: N/A;    Social History   Tobacco Use  . Smoking status: Former Smoker    Packs/day: 0.50    Years: 1.00    Pack years: 0.50    Types: Cigarettes    Last attempt to quit: 10/28/1998    Years since quitting: 19.5  . Smokeless tobacco: Never Used  Substance Use Topics  . Alcohol use: Yes    Alcohol/week: 0.0 standard drinks  . Drug use: Yes    Types: Marijuana    Comment: marijuana     Medication list has been reviewed and updated.  Current Meds  Medication Sig  . hydrochlorothiazide (HYDRODIURIL) 25 MG tablet TAKE 1 TABLET BY MOUTH DAILY  . lisinopril (PRINIVIL,ZESTRIL) 20 MG tablet TAKE 1 TABLET(20 MG) BY  MOUTH DAILY  . Multiple Vitamin (ONE-A-DAY MENS PO) Take 1 tablet by mouth daily.  . sertraline (ZOLOFT) 50 MG tablet TAKE 1 TABLET BY MOUTH DAILY  . tamsulosin (FLOMAX) 0.4 MG CAPS capsule Take 1 capsule (0.4 mg total) by mouth daily. Dr Erlene Quan prescribes    [facility] 2/9 Scores 04/26/2018 08/22/2017 08/13/2016 05/22/2015  PHQ - 2 Score 0 0 3 0  PHQ- 9 Score 3 2 13 6     Physical Exam Vitals signs and nursing note reviewed.  Constitutional:      General: He is not irritable. HENT:     Head: Normocephalic.     Right Ear: External ear normal.     Left Ear: External ear normal.     Nose: Nose normal.  Eyes:     General: No scleral icterus.       Right eye: No discharge.        Left eye: No discharge.     Conjunctiva/sclera: Conjunctivae normal.     Pupils: Pupils are equal, round, and reactive to light.  Neck:     Musculoskeletal: Normal range of motion and neck supple.     Thyroid: No thyromegaly.     Vascular: No JVD.     Trachea: No tracheal deviation.  Cardiovascular:     Rate and Rhythm: Normal rate and regular rhythm.     Heart sounds: Normal heart sounds. No murmur. No friction rub. No gallop.   Pulmonary:     Effort: No respiratory distress.     Breath sounds: Normal breath sounds. No wheezing or rales.  Abdominal:     General: Bowel sounds are normal.     Palpations: Abdomen is soft. There is no mass.     Tenderness: There is no abdominal tenderness. There is no guarding or rebound.  Musculoskeletal: Normal range of motion.        General: No tenderness.  Lymphadenopathy:     Cervical: No cervical adenopathy.  Skin:    General: Skin is warm.     Findings: No rash.  Neurological:     Mental Status: He is alert and oriented to person, place, and time.     Cranial Nerves: No cranial nerve deficit.     Deep Tendon Reflexes: Reflexes are normal and symmetric.     BP 124/70   Pulse 68   Ht 5\' 11"  (1.803 m)   Wt 300 lb (136.1 kg)   BMI 41.84 kg/m   Assessment and  Plan: 1. Chronic diastolic congestive heart failure (HCC) Chronic. Stable. Controlled on medicine. Refill HCTZ and lisinopril - lisinopril (PRINIVIL,ZESTRIL) 20 MG tablet; One a day  Dispense: 90 tablet; Refill: 1 - hydrochlorothiazide (HYDRODIURIL) 25 MG tablet; Take 1 tablet (25 mg total) by mouth daily.  Dispense: 90 tablet; Refill: 1  2. Essential hypertension Chronic. Controlled on med.- refill lisinopril and HCTZ/ draw renal panel - Renal Function Panel - lisinopril (PRINIVIL,ZESTRIL) 20 MG tablet; One a day  Dispense: 90 tablet; Refill: 1 - hydrochlorothiazide (HYDRODIURIL) 25 MG tablet; Take 1 tablet (25 mg total) by mouth daily.  Dispense: 90 tablet; Refill: 1  3. Major depressive disorder in partial remission, unspecified whether recurrent (HCC) Chronic. Stable. PHQ9=3. Does not desire change in medication. Refill Sertraline 50mg  - sertraline (ZOLOFT) 50 MG tablet; TAKE 1 TABLET BY MOUTH DAILY  Dispense:  90 tablet; Refill: 1  4. Class 3 severe obesity due to excess calories without serious comorbidity with body mass index (BMI) of 40.0 to 44.9 in adult [facility]) Diet and exercise discussed. Draw lipid panel - Lipid Panel With LDL/HDL Ratio  5. Moderate mixed hyperlipidemia not requiring statin therapy Chronic. Has been able to control with diet and exercise. Will draw lipid today to see if medication needs to be started. - Lipid Panel With LDL/HDL Ratio

## 2018-04-26 NOTE — Patient Instructions (Signed)
Mediterranean Diet  A Mediterranean diet refers to food and lifestyle choices that are based on the traditions of countries located on the [place]. This way of eating has been shown to help prevent certain conditions and improve outcomes for people who have chronic diseases, like kidney disease and heart disease.  What are tips for following this plan?  Lifestyle   Cook and eat meals together with your family, when possible.   Drink enough fluid to keep your urine clear or pale yellow.   Be physically active every day. This includes:  ? Aerobic exercise like running or swimming.  ? Leisure activities like gardening, walking, or housework.   Get 7-8 hours of sleep each night.   If recommended by your health care provider, drink red wine in moderation. This means 1 glass a day for nonpregnant women and 2 glasses a day for men. A glass of wine equals 5 oz (150 mL).  Reading food labels     Check the serving size of packaged foods. For foods such as rice and pasta, the serving size refers to the amount of cooked product, not dry.   Check the total fat in packaged foods. Avoid foods that have saturated fat or trans fats.   Check the ingredients list for added sugars, such as corn syrup.  Shopping   At the grocery store, buy most of your food from the areas near the walls of the store. This includes:  ? Fresh fruits and vegetables (produce).  ? Grains, beans, nuts, and seeds. Some of these may be available in unpackaged forms or large amounts (in bulk).  ? Fresh seafood.  ? Poultry and eggs.  ? Low-fat dairy products.   Buy whole ingredients instead of prepackaged foods.   Buy fresh fruits and vegetables in-season from local farmers markets.   Buy frozen fruits and vegetables in resealable bags.   If you do not have access to quality fresh seafood, buy precooked frozen shrimp or canned fish, such as tuna, salmon, or sardines.   Buy small amounts of raw or cooked vegetables, salads, or olives from  the deli or salad bar at your store.   Stock your pantry so you always have certain foods on hand, such as olive oil, canned tuna, canned tomatoes, rice, pasta, and beans.  Cooking   Cook foods with extra-virgin olive oil instead of using butter or other vegetable oils.   Have meat as a side dish, and have vegetables or grains as your main dish. This means having meat in small portions or adding small amounts of meat to foods like pasta or stew.   Use beans or vegetables instead of meat in common dishes like chili or lasagna.   Experiment with different cooking methods. Try roasting or broiling vegetables instead of steaming or sauteing them.   Add frozen vegetables to soups, stews, pasta, or rice.   Add nuts or seeds for added healthy fat at each meal. You can add these to yogurt, salads, or vegetable dishes.   Marinate fish or vegetables using olive oil, lemon juice, garlic, and fresh herbs.  Meal planning     Plan to eat 1 vegetarian meal one day each week. Try to work up to 2 vegetarian meals, if possible.   Eat seafood 2 or more times a week.   Have healthy snacks readily available, such as:  ? Vegetable sticks with hummus.  ? Greek yogurt.  ? Fruit and nut trail mix.   Eat balanced   meals throughout the week. This includes:  ? Fruit: 2-3 servings a day  ? Vegetables: 4-5 servings a day  ? Low-fat dairy: 2 servings a day  ? Fish, poultry, or lean meat: 1 serving a day  ? Beans and legumes: 2 or more servings a week  ? Nuts and seeds: 1-2 servings a day  ? Whole grains: 6-8 servings a day  ? Extra-virgin olive oil: 3-4 servings a day   Limit red meat and sweets to only a few servings a month  What are my food choices?   Mediterranean diet  ? Recommended  ? Grains: Whole-grain pasta. Brown rice. Bulgar wheat. Polenta. Couscous. Whole-wheat bread. Oatmeal. Quinoa.  ? Vegetables: Artichokes. Beets. Broccoli. Cabbage. Carrots. Eggplant. Green beans. Chard. Kale. Spinach. Onions. Leeks. Peas. Squash.  Tomatoes. Peppers. Radishes.  ? Fruits: Apples. Apricots. Avocado. Berries. Bananas. Cherries. Dates. Figs. Grapes. Lemons. Melon. Oranges. Peaches. Plums. Pomegranate.  ? Meats and other protein foods: Beans. Almonds. Sunflower seeds. Pine nuts. Peanuts. Cod. Salmon. Scallops. Shrimp. Tuna. Tilapia. Clams. Oysters. Eggs.  ? Dairy: Low-fat milk. Cheese. Greek yogurt.  ? Beverages: Water. Red wine. Herbal tea.  ? Fats and oils: Extra virgin olive oil. Avocado oil. Grape seed oil.  ? Sweets and desserts: Greek yogurt with honey. Baked apples. Poached pears. Trail mix.  ? Seasoning and other foods: Basil. Cilantro. Coriander. Cumin. Mint. Parsley. Sage. Rosemary. Tarragon. Garlic. Oregano. Thyme. Pepper. Balsalmic vinegar. Tahini. Hummus. Tomato sauce. Olives. Mushrooms.  ? Limit these  ? Grains: Prepackaged pasta or rice dishes. Prepackaged cereal with added sugar.  ? Vegetables: Deep fried potatoes (french fries).  ? Fruits: Fruit canned in syrup.  ? Meats and other protein foods: Beef. Pork. Lamb. Poultry with skin. Hot dogs. Bacon.  ? Dairy: Ice cream. Sour cream. Whole milk.  ? Beverages: Juice. Sugar-sweetened soft drinks. Beer. Liquor and spirits.  ? Fats and oils: Butter. Canola oil. Vegetable oil. Beef fat (tallow). Lard.  ? Sweets and desserts: Cookies. Cakes. Pies. Candy.  ? Seasoning and other foods: Mayonnaise. Premade sauces and marinades.  ? The items listed may not be a complete list. Talk with your dietitian about what dietary choices are right for you.  Summary   The Mediterranean diet includes both food and lifestyle choices.   Eat a variety of fresh fruits and vegetables, beans, nuts, seeds, and whole grains.   Limit the amount of red meat and sweets that you eat.   Talk with your health care provider about whether it is safe for you to drink red wine in moderation. This means 1 glass a day for nonpregnant women and 2 glasses a day for men. A glass of wine equals 5 oz (150 mL).  This information  is not intended to replace advice given to you by your health care provider. Make sure you discuss any questions you have with your health care provider.  Document Released: 10/09/2015 Document Revised: 11/11/2015 Document Reviewed: 10/09/2015  Elsevier Interactive Patient Education  2019 [employer] Inc[employer]

## 2018-04-27 LAB — RENAL FUNCTION PANEL
ALBUMIN: 4.3 g/dL (ref 3.8–4.9)
BUN/Creatinine Ratio: 12 (ref 9–20)
BUN: 15 mg/dL (ref 6–24)
CHLORIDE: 109 mmol/L — AB (ref 96–106)
CO2: 22 mmol/L (ref 20–29)
Calcium: 9.5 mg/dL (ref 8.7–10.2)
Creatinine, Ser: 1.3 mg/dL — ABNORMAL HIGH (ref 0.76–1.27)
GFR calc Af Amer: 69 mL/min/{1.73_m2} (ref >59)
GFR calc non Af Amer: 60 mL/min/{1.73_m2} (ref >59)
GLUCOSE: 100 mg/dL — AB (ref 65–99)
PHOSPHORUS: 3.1 mg/dL (ref 2.8–4.1)
POTASSIUM: 4.4 mmol/L (ref 3.5–5.2)
Sodium: 144 mmol/L (ref 134–144)

## 2018-04-27 LAB — LIPID PANEL WITH LDL/HDL RATIO
Cholesterol, Total: 168 mg/dL (ref 100–199)
HDL: 27 mg/dL — ABNORMAL LOW (ref >39)
LDL Calculated: 94 mg/dL (ref 0–99)
LDl/HDL Ratio: 3.5 ratio (ref 0.0–3.6)
Triglycerides: 233 mg/dL — ABNORMAL HIGH (ref 0–149)
VLDL Cholesterol Cal: 47 mg/dL — ABNORMAL HIGH (ref 5–40)

## 2018-05-09 LAB — COMPREHENSIVE METABOLIC PANEL: Albumin: 4.4 (ref 3.5–5.0)

## 2018-05-09 LAB — HEPATIC FUNCTION PANEL
ALT: 27 U/L (ref 10–40)
AST: 17 (ref 14–40)
Alkaline Phosphatase: 71 (ref 25–125)
Bilirubin, Total: 0.5

## 2018-09-18 ENCOUNTER — Other Ambulatory Visit: Payer: Self-pay

## 2018-09-18 DIAGNOSIS — F324 Major depressive disorder, single episode, in partial remission: Secondary | ICD-10-CM

## 2018-09-18 MED ORDER — SERTRALINE HCL 50 MG PO TABS: ORAL_TABLET | ORAL | 0 refills | Status: DC

## 2018-10-18 ENCOUNTER — Ambulatory Visit: Payer: Self-pay | Admitting: Family Medicine

## 2018-10-24 ENCOUNTER — Other Ambulatory Visit: Payer: Self-pay

## 2018-10-24 DIAGNOSIS — R972 Elevated prostate specific antigen [PSA]: Secondary | ICD-10-CM

## 2018-10-25 ENCOUNTER — Other Ambulatory Visit
Admission: RE | Admit: 2018-10-25 | Discharge: 2018-10-25 | Disposition: A | Discharge status: Home / Self Care | Payer: BLUE CROSS/BLUE SHIELD | Attending: Urology | Admitting: Urology

## 2018-10-25 DIAGNOSIS — R972 Elevated prostate specific antigen [PSA]: Secondary | ICD-10-CM

## 2018-10-25 LAB — PSA: Prostatic Specific Antigen: 3.48 ng/mL (ref 0.00–4.00)

## 2018-10-27 ENCOUNTER — Encounter: Payer: Self-pay | Admitting: Urology

## 2018-10-27 ENCOUNTER — Other Ambulatory Visit: Payer: Self-pay

## 2018-10-27 ENCOUNTER — Ambulatory Visit: Payer: BLUE CROSS/BLUE SHIELD | Admitting: Urology

## 2018-10-27 VITALS — BP 143/87 | HR 56 | Ht 72.0 in | Wt 300.0 lb

## 2018-10-27 DIAGNOSIS — N138 Other obstructive and reflux uropathy: Secondary | ICD-10-CM | POA: Diagnosis not present

## 2018-10-27 DIAGNOSIS — N401 Enlarged prostate with lower urinary tract symptoms: Secondary | ICD-10-CM

## 2018-10-27 DIAGNOSIS — R972 Elevated prostate specific antigen [PSA]: Secondary | ICD-10-CM | POA: Diagnosis not present

## 2018-10-27 LAB — BLADDER SCAN AMB NON-IMAGING

## 2018-10-27 NOTE — Progress Notes (Signed)
10/27/2018 1:53 PM   Alejandro Winters 08/17/1960 [id number]  Referring provider: Juline Patch, MD [address] [place],  [place] [address]  Chief Complaint  Patient presents with  . Benign Prostatic Hypertrophy    1year w/PVR    HPI: 58 year old male with BPH and obstructive urinary symptoms returns for routine annual follow-up.  PSA stable at 3.48 on 10/25/2018.  This is stable from previous years.  He is on Flomax.  He believes this has helped dramatically with his urinary symptoms.  He has no side effects with the medication.  He is happy with this regimen.  IPSS as below.  He has no urinary complaints today.  Personal history of CKD, creatinine stable at 1.3 on 04/2018.  IPSS    Row Name 10/27/18 1300         International Prostate Symptom Score   How often have you had the sensation of not emptying your bladder?  Less than half the time     How often have you had to urinate less than every two hours?  Less than 1 in 5 times     How often have you found you stopped and started again several times when you urinated?  Less than 1 in 5 times     How often have you found it difficult to postpone urination?  Not at All     How often have you had a weak urinary stream?  Less than 1 in 5 times     How often have you had to strain to start urination?  Less than 1 in 5 times     How many times did you typically get up at night to urinate?  1 Time     Total IPSS Score  7       Quality of Life due to urinary symptoms   If you were to spend the rest of your life with your urinary condition just the way it is now how would you feel about that?  Mostly Satisfied        Score:  1-7 Mild 8-19 Moderate 20-35 Severe   PMH: Past Medical History:  Diagnosis Date  . CHF (congestive heart failure) ([place])   . Depression   . Hyperlipemia   . Hypertension   . Kidney failure   . Sleep apnea   . Vitiligo     Surgical History: Past Surgical History:  Procedure  Laterality Date  . COLONOSCOPY WITH PROPOFOL N/A 12/16/2014   Procedure: COLONOSCOPY WITH PROPOFOL;  Surgeon: Josefine Class, MD;  Location[facility] ENDOSCOPY;  Service: Endoscopy;  Laterality: N/A;    Home Medications:  Allergies as of 10/27/2018   No Known Allergies     Medication List       Accurate as of October 27, 2018  1:53 PM. If you have any questions, ask your nurse or doctor.        Coenzyme Q10 10 MG capsule Take by mouth.   hydrochlorothiazide 25 MG tablet Commonly known as: HYDRODIURIL Take 1 tablet (25 mg total) by mouth daily.   lisinopril 20 MG tablet Commonly known as: ZESTRIL One a day   ONE-A-DAY MENS PO Take 1 tablet by mouth daily.   pravastatin 40 MG tablet Commonly known as: PRAVACHOL   sertraline 50 MG tablet Commonly known as: ZOLOFT TAKE 1 TABLET BY MOUTH DAILY   tamsulosin 0.4 MG Caps capsule Commonly known as: FLOMAX Take 1 capsule (0.4 mg total) by mouth daily. Dr  Erlene Quan prescribes       Allergies: No Known Allergies  Family History: Family History  Problem Relation Age of Onset  . Hypertension Mother   . Cancer Father   . Hypertension Father   . Prostate cancer Neg Hx   . Bladder Cancer Neg Hx   . Kidney cancer Neg Hx     Social History:  reports that he quit smoking about 20 years ago. His smoking use included cigarettes. He has a 0.50 pack-year smoking history. He has never used smokeless tobacco. He reports current alcohol use. He reports current drug use. Drug: Marijuana.  ROS: UROLOGY Frequent Urination?: No Hard to postpone urination?: No Burning/pain with urination?: No Get up at night to urinate?: No Leakage of urine?: No Urine stream starts and stops?: No Trouble starting stream?: No Do you have to strain to urinate?: No Blood in urine?: No Urinary tract infection?: No Sexually transmitted disease?: No Injury to kidneys or bladder?: No Painful intercourse?: No Weak stream?: No Erection problems?: No  Penile pain?: No  Gastrointestinal Nausea?: No Vomiting?: No Indigestion/heartburn?: No Diarrhea?: No Constipation?: No  Constitutional Fever: No Night sweats?: No Weight loss?: No Fatigue?: Yes  Skin Skin rash/lesions?: No Itching?: No  Eyes Blurred vision?: No Double vision?: No  Ears/Nose/Throat Sore throat?: No Sinus problems?: No  Hematologic/Lymphatic Swollen glands?: No Easy bruising?: No  Cardiovascular Leg swelling?: Yes Chest pain?: No  Respiratory Cough?: No Shortness of breath?: No  Endocrine Excessive thirst?: No  Musculoskeletal Back pain?: Yes Joint pain?: No  Neurological Headaches?: No Dizziness?: No  Psychologic Depression?: No Anxiety?: No  Physical Exam: BP (!) 143/87   Pulse (!) 56   Ht 6' (1.829 m)   Wt 300 lb (136.1 kg)   BMI 40.69 kg/m   Constitutional:  Alert and oriented, No acute distress.  Vitiligo appreciated. HEENT: [place] AT, moist mucus membranes.  Trachea midline, no masses. Cardiovascular: No clubbing, cyanosis, or edema. Respiratory: Normal respiratory effort, no increased work of breathing. GI: Abdomen is soft, nontender, nondistended, no abdominal masses GU: No CVA tenderness Rectal: Normal sphincter tone.  40 cc prostate, nontender no nodules. Skin: No rashes, bruises or suspicious lesions. Neurologic: Grossly intact, no focal deficits, moving all 4 extremities. Psychiatric: Normal mood and affect.  Laboratory Data: Lab Results  Component Value Date   WBC 6.6 09/24/2016   HGB 12.5 (L) 09/24/2016   HCT 38.2 09/24/2016   MCV 98 (H) 09/24/2016   PLT 257 09/24/2016    Lab Results  Component Value Date   CREATININE 1.30 (H) 04/26/2018    Lab Results  Component Value Date   HGBA1C 4.7 (L) 08/22/2017    Pertinent Imaging: Results for orders placed or performed in visit on 10/27/18  BLADDER SCAN AMB NON-IMAGING  Result Value Ref Range   Scan Result 13ml     Assessment & Plan:    1. Benign  prostatic hyperplasia with urinary obstruction Symptoms stable on Flomax We will continue this medication We did briefly discuss today a surgical or procedural options to get him off this medication but is not interested at this time We discussed that if his urinary symptoms remain stable, he may follow-up with his primary care physician otherwise return as needed  2. Elevated PSA PSA is stable, unchanged x2 years which is reassuring Rectal exam is benign Recommended continued annual screening which can be done by his primary care physician - BLADDER SCAN AMB NON-IMAGING   Return if symptoms worsen or fail to  improve.  Hollice Espy, MD  [facility] [address], [place], [place] [address] [phone]

## 2018-11-02 ENCOUNTER — Ambulatory Visit (INDEPENDENT_AMBULATORY_CARE_PROVIDER_SITE_OTHER): Payer: BLUE CROSS/BLUE SHIELD | Admitting: Family Medicine

## 2018-11-02 ENCOUNTER — Encounter: Payer: Self-pay | Admitting: Family Medicine

## 2018-11-02 ENCOUNTER — Other Ambulatory Visit: Payer: Self-pay

## 2018-11-02 VITALS — BP 120/82 | HR 82 | Resp 16 | Ht 72.0 in | Wt 300.0 lb

## 2018-11-02 DIAGNOSIS — I5032 Chronic diastolic (congestive) heart failure: Secondary | ICD-10-CM | POA: Diagnosis not present

## 2018-11-02 DIAGNOSIS — F324 Major depressive disorder, single episode, in partial remission: Secondary | ICD-10-CM

## 2018-11-02 DIAGNOSIS — R351 Nocturia: Secondary | ICD-10-CM

## 2018-11-02 DIAGNOSIS — N401 Enlarged prostate with lower urinary tract symptoms: Secondary | ICD-10-CM

## 2018-11-02 DIAGNOSIS — Z23 Encounter for immunization: Secondary | ICD-10-CM

## 2018-11-02 DIAGNOSIS — I10 Essential (primary) hypertension: Secondary | ICD-10-CM

## 2018-11-02 DIAGNOSIS — N183 Chronic kidney disease, stage 3 unspecified: Secondary | ICD-10-CM

## 2018-11-02 DIAGNOSIS — E782 Mixed hyperlipidemia: Secondary | ICD-10-CM

## 2018-11-02 MED ORDER — LISINOPRIL 20 MG PO TABS: ORAL_TABLET | ORAL | 1 refills | Status: DC

## 2018-11-02 MED ORDER — TAMSULOSIN HCL 0.4 MG PO CAPS: 0.4000 mg | ORAL_CAPSULE | Freq: Every day | ORAL | 2 refills | Status: DC

## 2018-11-02 MED ORDER — HYDROCHLOROTHIAZIDE 25 MG PO TABS: 25.0000 mg | ORAL_TABLET | Freq: Every day | ORAL | 1 refills | Status: DC

## 2018-11-02 MED ORDER — SERTRALINE HCL 50 MG PO TABS: ORAL_TABLET | ORAL | 1 refills | Status: DC

## 2018-11-02 MED ORDER — PRAVASTATIN SODIUM 40 MG PO TABS: 40.0000 mg | ORAL_TABLET | Freq: Every day | ORAL | 1 refills | Status: DC

## 2018-11-02 NOTE — Progress Notes (Signed)
Date:  11/02/2018   Name:  Alejandro Winters   DOB:  06/22/1960   MRN:  [id number]   Chief Complaint: Hypertension  Hypertension This is a chronic problem. The current episode started more than 1 year ago. The problem has been gradually improving since onset. The problem is controlled. Pertinent negatives include no anxiety, blurred vision, chest pain, headaches, malaise/fatigue, neck pain, orthopnea, palpitations, peripheral edema, PND, shortness of breath or sweats. There are no associated agents to hypertension. There are no known risk factors for coronary artery disease. Past treatments include ACE inhibitors and diuretics. The current treatment provides moderate improvement. There are no compliance problems.  There is no history of angina, kidney disease, CAD/MI, CVA, heart failure, left ventricular hypertrophy, PVD or retinopathy. There is no history of chronic renal disease, a hypertension causing med or renovascular disease.  Depression        This is a chronic problem.  The current episode started more than 1 year ago.   The onset quality is sudden.   The problem occurs intermittently (trial off medication).  Associated symptoms include no decreased concentration, no fatigue, no helplessness, no hopelessness, does not have insomnia, not irritable, no restlessness, no decreased interest, no appetite change, no body aches, no myalgias, no headaches, no indigestion, not sad and no suicidal ideas.     The symptoms are aggravated by work stress (covid stress).  Past treatments include SSRIs - Selective serotonin reuptake inhibitors.  Compliance with treatment is good.  Previous treatment provided mild relief.   Pertinent negatives include no anxiety. Hyperlipidemia This is a chronic problem. The current episode started more than 1 year ago. The problem is controlled. Recent lipid tests were reviewed and are normal. He has no history of chronic renal disease. Pertinent negatives include no chest pain,  focal sensory loss, focal weakness, leg pain, myalgias or shortness of breath. Current antihyperlipidemic treatment includes statins. The current treatment provides moderate improvement of lipids. There are no compliance problems.  Risk factors for coronary artery disease include dyslipidemia, male sex, hypertension, post-menopausal and obesity.  Benign Prostatic Hypertrophy This is a chronic problem. The current episode started more than 1 year ago. The problem is unchanged. Irritative symptoms include nocturia. Irritative symptoms do not include frequency or urgency. Obstructive symptoms do not include dribbling, incomplete emptying, an intermittent stream, a slower stream or straining. Associated symptoms include hesitancy. Pertinent negatives include no chills, dysuria, hematuria or nausea.  Congestive Heart Failure Presents for follow-up visit. Associated symptoms include nocturia. Pertinent negatives include no abdominal pain, chest pain, chest pressure, claudication, edema, fatigue, orthopnea, palpitations, paroxysmal nocturnal dyspnea or shortness of breath. The symptoms have been stable.    Review of Systems  Constitutional: Negative for appetite change, chills, fatigue, fever and malaise/fatigue.  HENT: Negative for drooling, ear discharge, ear pain and sore throat.   Eyes: Negative for blurred vision.  Respiratory: Negative for cough, shortness of breath and wheezing.   Cardiovascular: Negative for chest pain, palpitations, orthopnea, claudication, leg swelling and PND.  Gastrointestinal: Negative for abdominal pain, blood in stool, constipation, diarrhea and nausea.  Endocrine: Negative for polydipsia.  Genitourinary: Positive for hesitancy and nocturia. Negative for dysuria, frequency, hematuria, incomplete emptying and urgency.  Musculoskeletal: Negative for back pain, myalgias and neck pain.  Skin: Negative for rash.  Allergic/Immunologic: Negative for environmental allergies.   Neurological: Negative for dizziness, focal weakness and headaches.  Hematological: Does not bruise/bleed easily.  Psychiatric/Behavioral: Positive for depression. Negative for decreased concentration and  suicidal ideas. The patient is not nervous/anxious and does not have insomnia.     Patient Active Problem List   Diagnosis Date Noted  . Mixed hyperlipidemia 08/22/2017  . Essential hypertension 09/24/2016  . CKD (chronic kidney disease) stage 3, GFR 30-59 ml/min (HCC) 09/24/2016    No Known Allergies  Past Surgical History:  Procedure Laterality Date  . COLONOSCOPY WITH PROPOFOL N/A 12/16/2014   Procedure: COLONOSCOPY WITH PROPOFOL;  Surgeon: Josefine Class, MD;  Location[facility];  Service: Endoscopy;  Laterality: N/A;    Social History   Tobacco Use  . Smoking status: Former Smoker    Packs/day: 0.50    Years: 1.00    Pack years: 0.50    Types: Cigarettes    Quit date: 10/28/1998    Years since quitting: 20.0  . Smokeless tobacco: Never Used  Substance Use Topics  . Alcohol use: Yes    Alcohol/week: 0.0 standard drinks  . Drug use: Yes    Types: Marijuana    Comment: marijuana     Medication list has been reviewed and updated.  Current Meds  Medication Sig  . ACAI BERRY PO Take by mouth.  . Apple Cider Vinegar 300 MG TABS Take by mouth.  . Coenzyme Q10 10 MG capsule Take by mouth.  . hydrochlorothiazide (HYDRODIURIL) 25 MG tablet Take 1 tablet (25 mg total) by mouth daily.  Marland Kitchen lisinopril (PRINIVIL,ZESTRIL) 20 MG tablet One a day  . Multiple Vitamin (ONE-A-DAY MENS PO) Take 1 tablet by mouth daily.  . pravastatin (PRAVACHOL) 40 MG tablet   . sertraline (ZOLOFT) 50 MG tablet TAKE 1 TABLET BY MOUTH DAILY  . tamsulosin (FLOMAX) 0.4 MG CAPS capsule Take 1 capsule (0.4 mg total) by mouth daily. Dr Erlene Quan prescribes    [facility] 2/9 Scores 11/02/2018 04/26/2018 08/22/2017 08/13/2016  PHQ - 2 Score 0 0 0 3  PHQ- 9 Score 1 3 2 13     BP Readings from Last 3  Encounters:  11/02/18 120/82  10/27/18 (!) 143/87  04/26/18 124/70    Physical Exam Constitutional:      General: He is not irritable.    Wt Readings from Last 3 Encounters:  11/02/18 300 lb (136.1 kg)  10/27/18 300 lb (136.1 kg)  04/26/18 300 lb (136.1 kg)    BP 120/82   Pulse 82   Resp 16   Ht 6' (1.829 m)   Wt 300 lb (136.1 kg)   BMI 40.69 kg/m   Assessment and Plan: 1. Need for influenza vaccination Discussed and administered  2. Need for immunization against influenza Discussed and administered this is a duplication due to nurse placement as well as mine. - Flu Vaccine QUAD 36+ mos IM  3. Chronic diastolic congestive heart failure ([place]) Patient has a history of chronic diastolic congestive heart failure currently is doing well.  He is stable.  Patient notes no DOE, PND, or orthopnea.  We will continue lisinopril 20 mg once a day as well as hydrochlorothiazide 25 mg once a day. - lisinopril (ZESTRIL) 20 MG tablet; One a day  Dispense: 90 tablet; Refill: 1 - hydrochlorothiazide (HYDRODIURIL) 25 MG tablet; Take 1 tablet (25 mg total) by mouth daily.  Dispense: 90 tablet; Refill: 1  4. Essential hypertension Chronic.  Controlled.  Continue lisinopril 20 mg once a day.  Continue hydrochlorothiazide 25 mg once a day.  Will check renal function panel. - Renal Function Panel - lisinopril (ZESTRIL) 20 MG tablet; One a day  Dispense: 90  tablet; Refill: 1 - hydrochlorothiazide (HYDRODIURIL) 25 MG tablet; Take 1 tablet (25 mg total) by mouth daily.  Dispense: 90 tablet; Refill: 1  5. Major depressive disorder in partial remission, unspecified whether recurrent ([place]) Patient tried to do without and found out per wife that he was not doing well off the medication so he has resumed his sertraline.  Currently his PHQ is 0.  Patient will has learned his lesson about stopping medications prior to discussion with physician. - sertraline (ZOLOFT) 50 MG tablet; TAKE 1 TABLET BY MOUTH  DAILY  Dispense: 90 tablet; Refill: 1  6. Mixed hyperlipidemia Chronic.  Controlled.  Continue with dietary approach for the time being.  We will check a lipid panel and we have discussed the possibility of initiating a statin. - Lipid panel - pravastatin (PRAVACHOL) 40 MG tablet; Take 1 tablet (40 mg total) by mouth daily.  Dispense: 90 tablet; Refill: 1  7. CKD (chronic kidney disease) stage 3, GFR 30-59 ml/min (HCC) Patient with history of CKD we will check a renal function panel to assess progression of CKD.  8. Benign prostatic hyperplasia with nocturia Patient is followed by urology but has been told that it would be okay for PCP to assume control of this we have verified tamsulosin refill.  And we have checked the PSA to see if this has indeed is in normal range at 3.48. - tamsulosin (FLOMAX) 0.4 MG CAPS capsule; Take 1 capsule (0.4 mg total) by mouth daily. Dr Erlene Quan prescribes  Dispense: 90 capsule; Refill: 2

## 2018-11-02 NOTE — Patient Instructions (Signed)
° °Calorie Counting for Weight Loss °Calories are units of energy. Your body needs a certain amount of calories from food to keep you going throughout the day. When you eat more calories than your body needs, your body stores the extra calories as fat. When you eat fewer calories than your body needs, your body burns fat to get the energy it needs. °Calorie counting means keeping track of how many calories you eat and drink each day. Calorie counting can be helpful if you need to lose weight. If you make sure to eat fewer calories than your body needs, you should lose weight. Ask your health care provider what a healthy weight is for you. °For calorie counting to work, you will need to eat the right number of calories in a day in order to lose a healthy amount of weight per week. A dietitian can help you determine how many calories you need in a day and will give you suggestions on how to reach your calorie goal. °· A healthy amount of weight to lose per week is usually 1-2 lb (0.5-0.9 kg). This usually means that your daily calorie intake should be reduced by 500-750 calories. °· Eating 1,200 - 1,500 calories per day can help most women lose weight. °· Eating 1,500 - 1,800 calories per day can help most men lose weight. °What is my plan? °My goal is to have __________ calories per day. °If I have this many calories per day, I should lose around __________ pounds per week. °What do I need to know about calorie counting? °In order to meet your daily calorie goal, you will need to: °· Find out how many calories are in each food you would like to eat. Try to do this before you eat. °· Decide how much of the food you plan to eat. °· Write down what you ate and how many calories it had. Doing this is called keeping a food log. °To successfully lose weight, it is important to balance calorie counting with a healthy lifestyle that includes regular activity. Aim for 150 minutes of moderate exercise (such as walking) or 75  minutes of vigorous exercise (such as running) each week. °Where do I find calorie information? ° °The number of calories in a food can be found on a Nutrition Facts label. If a food does not have a Nutrition Facts label, try to look up the calories online or ask your dietitian for help. °Remember that calories are listed per serving. If you choose to have more than one serving of a food, you will have to multiply the calories per serving by the amount of servings you plan to eat. For example, the label on a package of bread might say that a serving size is 1 slice and that there are 90 calories in a serving. If you eat 1 slice, you will have eaten 90 calories. If you eat 2 slices, you will have eaten 180 calories. °How do I keep a food log? °Immediately after each meal, record the following information in your food log: °· What you ate. Don't forget to include toppings, sauces, and other extras on the food. °· How much you ate. This can be measured in cups, ounces, or number of items. °· How many calories each food and drink had. °· The total number of calories in the meal. °Keep your food log near you, such as in a small notebook in your pocket, or use a mobile app or website. Some programs will   calculate calories for you and show you how many calories you have left for the day to meet your goal. °What are some calorie counting tips? ° °· Use your calories on foods and drinks that will fill you up and not leave you hungry: °? Some examples of foods that fill you up are nuts and nut butters, vegetables, lean proteins, and high-fiber foods like whole grains. High-fiber foods are foods with more than 5 g fiber per serving. °? Drinks such as sodas, specialty coffee drinks, alcohol, and juices have a lot of calories, yet do not fill you up. °· Eat nutritious foods and avoid empty calories. Empty calories are calories you get from foods or beverages that do not have many vitamins or protein, such as candy, sweets, and  soda. It is better to have a nutritious high-calorie food (such as an avocado) than a food with few nutrients (such as a bag of chips). °· Know how many calories are in the foods you eat most often. This will help you calculate calorie counts faster. °· Pay attention to calories in drinks. Low-calorie drinks include water and unsweetened drinks. °· Pay attention to nutrition labels for "low fat" or "fat free" foods. These foods sometimes have the same amount of calories or more calories than the full fat versions. They also often have added sugar, starch, or salt, to make up for flavor that was removed with the fat. °· Find a way of tracking calories that works for you. Get creative. Try different apps or programs if writing down calories does not work for you. °What are some portion control tips? °· Know how many calories are in a serving. This will help you know how many servings of a certain food you can have. °· Use a measuring cup to measure serving sizes. You could also try weighing out portions on a kitchen scale. With time, you will be able to estimate serving sizes for some foods. °· Take some time to put servings of different foods on your favorite plates, bowls, and cups so you know what a serving looks like. °· Try not to eat straight from a bag or box. Doing this can lead to overeating. Put the amount you would like to eat in a cup or on a plate to make sure you are eating the right portion. °· Use smaller plates, glasses, and bowls to prevent overeating. °· Try not to multitask (for example, watch TV or use your computer) while eating. If it is time to eat, sit down at a table and enjoy your food. This will help you to know when you are full. It will also help you to be aware of what you are eating and how much you are eating. °What are tips for following this plan? °Reading food labels °· Check the calorie count compared to the serving size. The serving size may be smaller than what you are used to  eating. °· Check the source of the calories. Make sure the food you are eating is high in vitamins and protein and low in saturated and trans fats. °Shopping °· Read nutrition labels while you shop. This will help you make healthy decisions before you decide to purchase your food. °· Make a grocery list and stick to it. °Cooking °· Try to cook your favorite foods in a healthier way. For example, try baking instead of frying. °· Use low-fat dairy products. °Meal planning °· Use more fruits and vegetables. Half of your plate should be   fruits and vegetables. °· Include lean proteins like poultry and fish. °How do I count calories when eating out? °· Ask for smaller portion sizes. °· Consider sharing an entree and sides instead of getting your own entree. °· If you get your own entree, eat only half. Ask for a box at the beginning of your meal and put the rest of your entree in it so you are not tempted to eat it. °· If calories are listed on the menu, choose the lower calorie options. °· Choose dishes that include vegetables, fruits, whole grains, low-fat dairy products, and lean protein. °· Choose items that are boiled, broiled, grilled, or steamed. Stay away from items that are buttered, battered, fried, or served with cream sauce. Items labeled "crispy" are usually fried, unless stated otherwise. °· Choose water, low-fat milk, unsweetened iced tea, or other drinks without added sugar. If you want an alcoholic beverage, choose a lower calorie option such as a glass of wine or light beer. °· Ask for dressings, sauces, and syrups on the side. These are usually high in calories, so you should limit the amount you eat. °· If you want a salad, choose a garden salad and ask for grilled meats. Avoid extra toppings like bacon, cheese, or fried items. Ask for the dressing on the side, or ask for olive oil and vinegar or lemon to use as dressing. °· Estimate how many servings of a food you are given. For example, a serving of  cooked rice is ½ cup or about the size of half a baseball. Knowing serving sizes will help you be aware of how much food you are eating at restaurants. The list below tells you how big or small some common portion sizes are based on everyday objects: °? 1 oz--4 stacked dice. °? 3 oz--1 deck of cards. °? 1 tsp--1 die. °? 1 Tbsp--½ a ping-pong ball. °? 2 Tbsp--1 ping-pong ball. °? ½ cup--½ baseball. °? 1 cup--1 baseball. °Summary °· Calorie counting means keeping track of how many calories you eat and drink each day. If you eat fewer calories than your body needs, you should lose weight. °· A healthy amount of weight to lose per week is usually 1-2 lb (0.5-0.9 kg). This usually means reducing your daily calorie intake by 500-750 calories. °· The number of calories in a food can be found on a Nutrition Facts label. If a food does not have a Nutrition Facts label, try to look up the calories online or ask your dietitian for help. °· Use your calories on foods and drinks that will fill you up, and not on foods and drinks that will leave you hungry. °· Use smaller plates, glasses, and bowls to prevent overeating. °This information is not intended to replace advice given to you by your health care provider. Make sure you discuss any questions you have with your health care provider. °Document Released: 02/15/2005 Document Revised: 11/04/2017 Document Reviewed: 01/16/2016 °Elsevier Patient Education © 2020 [employer] ° °

## 2018-11-03 LAB — LIPID PANEL
Chol/HDL Ratio: 5 ratio (ref 0.0–5.0)
Cholesterol, Total: 154 mg/dL (ref 100–199)
HDL: 31 mg/dL — ABNORMAL LOW (ref >39)
LDL Chol Calc (NIH): 84 mg/dL (ref 0–99)
Triglycerides: 234 mg/dL — ABNORMAL HIGH (ref 0–149)
VLDL Cholesterol Cal: 39 mg/dL (ref 5–40)

## 2018-11-03 LAB — RENAL FUNCTION PANEL
Albumin: 4.6 g/dL (ref 3.8–4.9)
BUN/Creatinine Ratio: 13 (ref 9–20)
BUN: 20 mg/dL (ref 6–24)
CO2: 24 mmol/L (ref 20–29)
Calcium: 10.2 mg/dL (ref 8.7–10.2)
Chloride: 103 mmol/L (ref 96–106)
Creatinine, Ser: 1.53 mg/dL — ABNORMAL HIGH (ref 0.76–1.27)
GFR calc Af Amer: 57 mL/min/{1.73_m2} — ABNORMAL LOW (ref >59)
GFR calc non Af Amer: 49 mL/min/{1.73_m2} — ABNORMAL LOW (ref >59)
Glucose: 102 mg/dL — ABNORMAL HIGH (ref 65–99)
Phosphorus: 2.9 mg/dL (ref 2.8–4.1)
Potassium: 3.8 mmol/L (ref 3.5–5.2)
Sodium: 142 mmol/L (ref 134–144)

## 2019-02-11 IMAGING — CR DG LUMBAR SPINE COMPLETE 4+V
5 series · 5 of 5 positions shown · non-contrast
Comparison: None.

CLINICAL DATA: Chronic pain with bilateral sciatica down to the
thigh. Pain ongoing for 2 years. Pain has recently gotten worse.
Worse on the right side.

EXAM:
LUMBAR SPINE - COMPLETE 4+ VIEW

[l-spine ap]
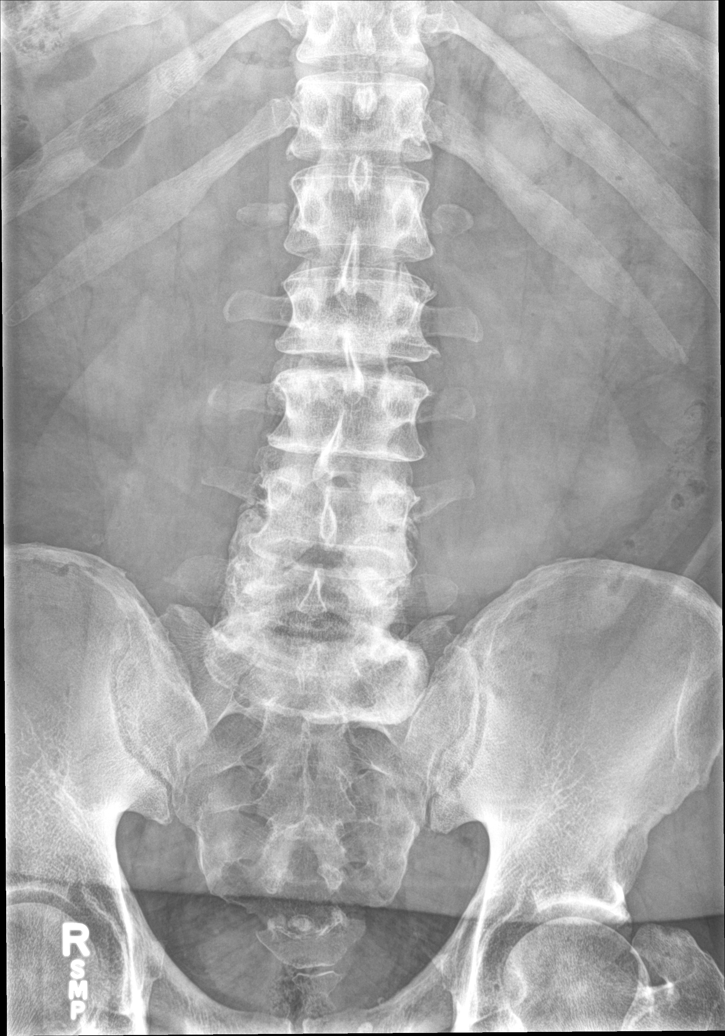

[l-spine obl (1 of 2)]
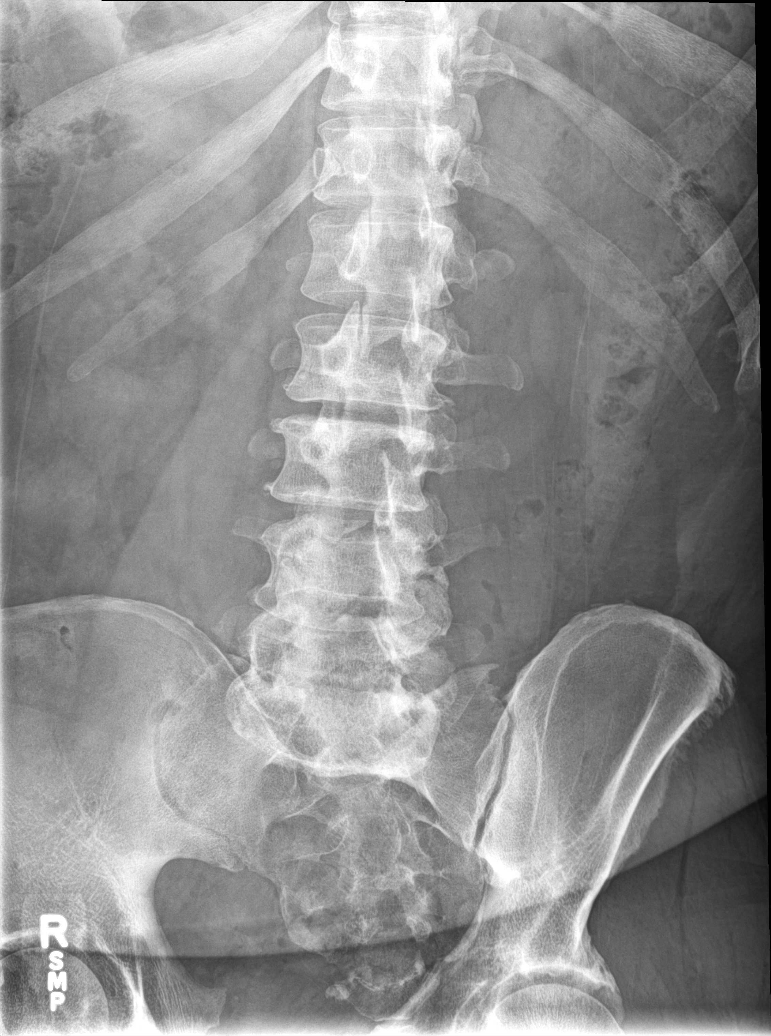

[l-spine obl (2 of 2)]
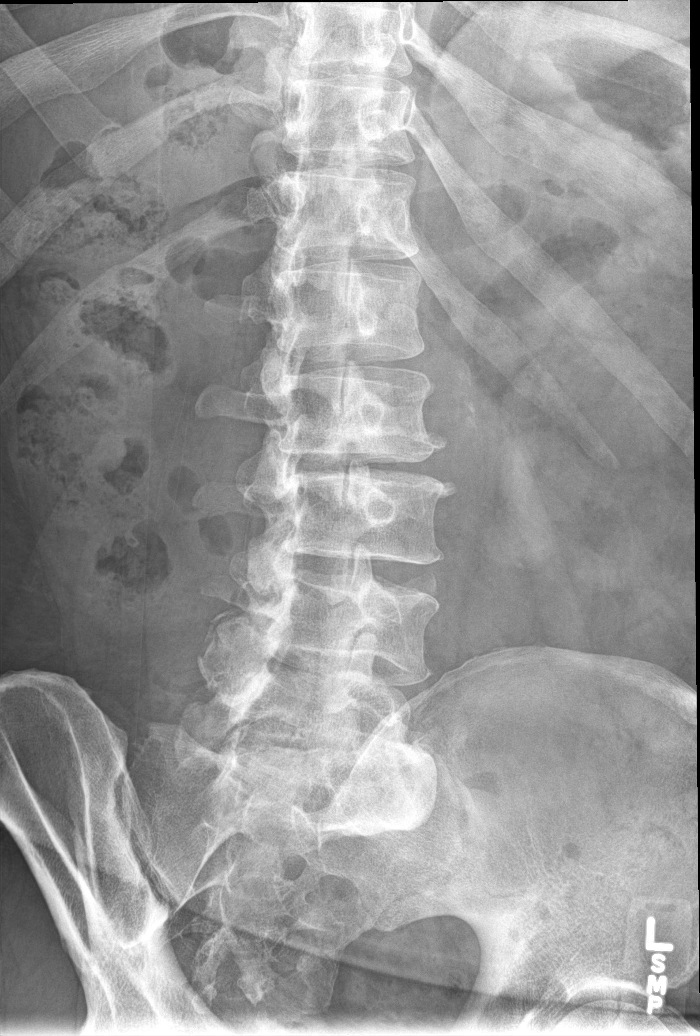

[l-spine spot]
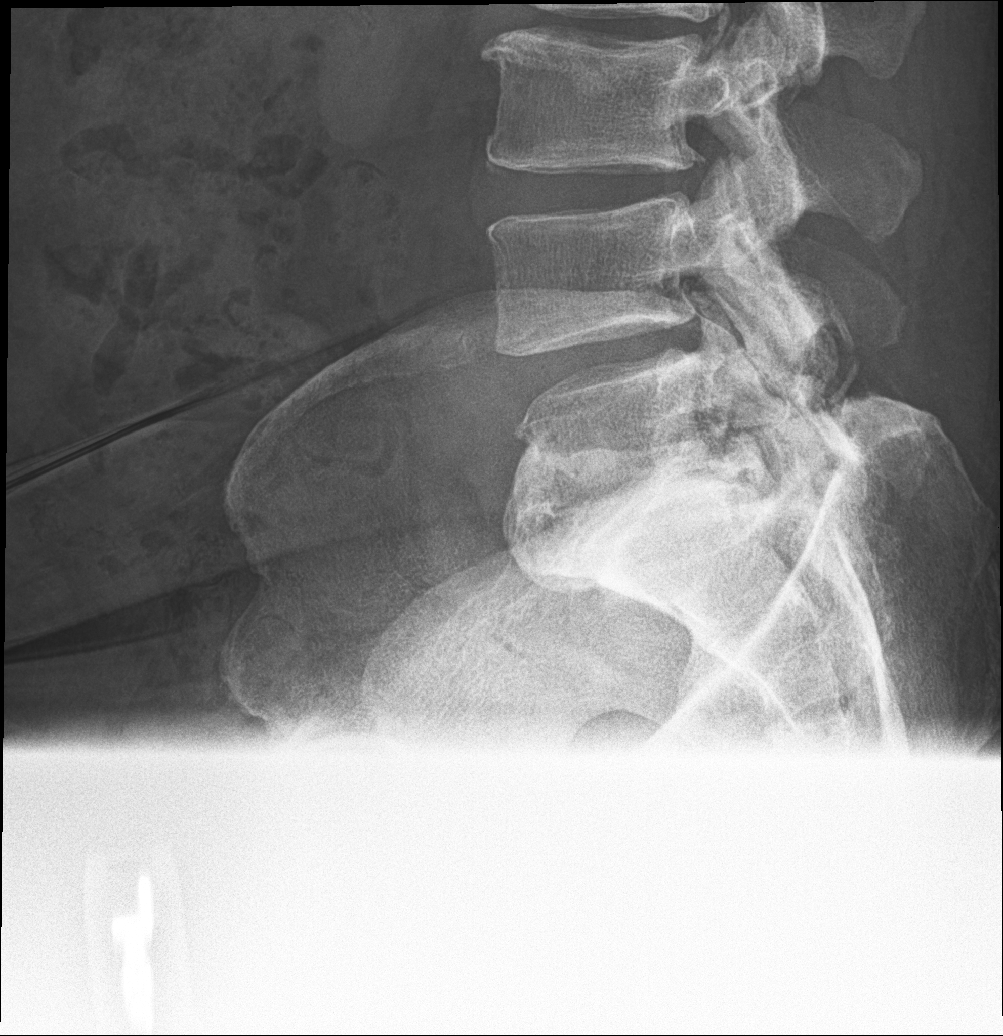

[l-spine lat]
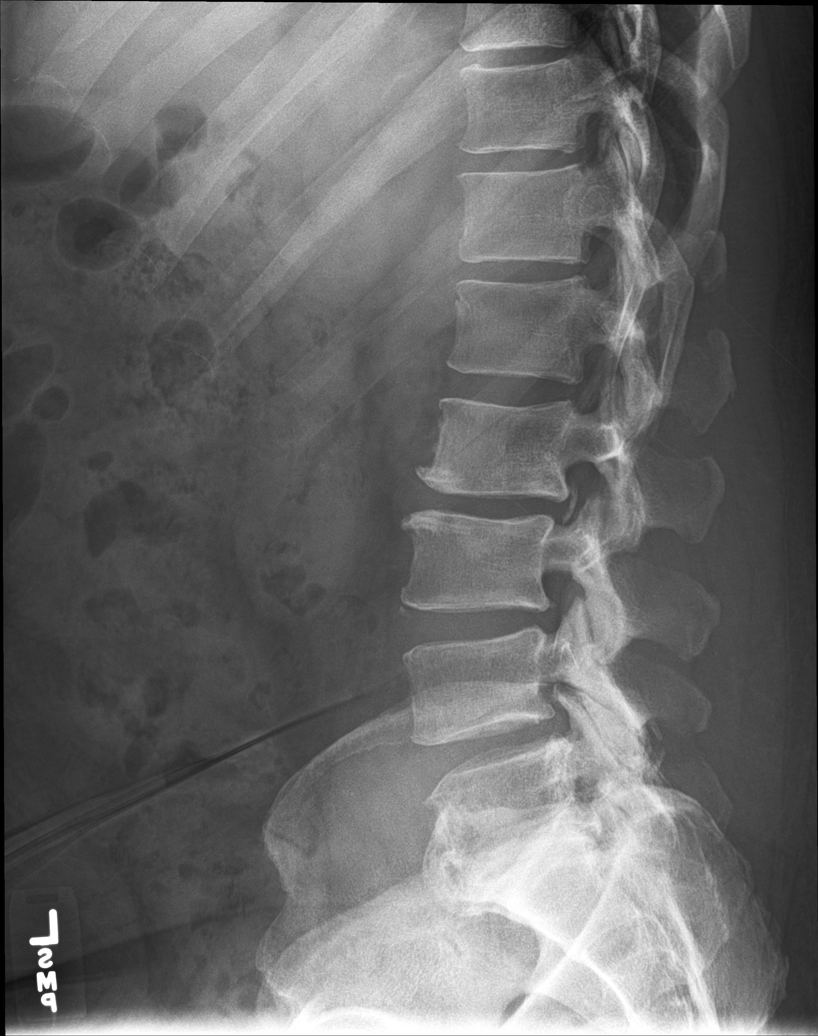

[5 of 5 positions shown; findings below may reference images not displayed]

FINDINGS: There is significant bridging osteophyte at L5-S1 with obscured or
obliterated disc space. There is disc height loss at L2-3 with there
is 6 mm of retrolisthesis. There is calcification on anterior
portion of the canal at L2-3. No suspicious lytic or blastic lesions
are identified. There are anomalous transverse processes at L1.
IMPRESSION: 1.  No evidence for acute  abnormality.
2. Significant degenerative changes, especially at L2-3 and L5-S1.

## 2019-05-03 ENCOUNTER — Ambulatory Visit: Payer: BLUE CROSS/BLUE SHIELD | Admitting: Family Medicine

## 2019-05-04 DIAGNOSIS — L80 Vitiligo: Secondary | ICD-10-CM | POA: Insufficient documentation

## 2019-05-07 DIAGNOSIS — N400 Enlarged prostate without lower urinary tract symptoms: Secondary | ICD-10-CM | POA: Insufficient documentation

## 2019-05-07 DIAGNOSIS — E66813 Obesity, class 3: Secondary | ICD-10-CM | POA: Insufficient documentation

## 2019-05-07 DIAGNOSIS — F39 Unspecified mood [affective] disorder: Secondary | ICD-10-CM | POA: Insufficient documentation

## 2019-06-13 LAB — LIPID PANEL: Cholesterol: 165 (ref 0–200)

## 2019-08-16 LAB — HM COLONOSCOPY

## 2020-08-15 LAB — COMPREHENSIVE METABOLIC PANEL
Albumin: 4.7 (ref 3.5–5.0)
Calcium: 10 (ref 8.7–10.7)
GFR calc Af Amer: 56
Globulin: 2.4

## 2020-08-15 LAB — BASIC METABOLIC PANEL
BUN: 16 (ref 4–21)
Chloride: 102 (ref 99–108)
Creatinine: 1.4 — AB (ref 0.6–1.3)
Glucose: 101
Potassium: 3.8 (ref 3.4–5.3)
Sodium: 142 (ref 137–147)

## 2020-08-15 LAB — CBC: RBC: 3.96 (ref 3.87–5.11)

## 2020-08-15 LAB — HEPATIC FUNCTION PANEL
ALT: 13 (ref 10–40)
AST: 13 — AB (ref 14–40)
Alkaline Phosphatase: 59 (ref 25–125)
Bilirubin, Total: 0.4

## 2020-08-15 LAB — PSA: PSA: 4.8

## 2020-08-15 LAB — TSH: TSH: 2.05 (ref 0.41–5.90)

## 2020-08-15 LAB — VITAMIN D 25 HYDROXY (VIT D DEFICIENCY, FRACTURES): Vit D, 25-Hydroxy: 30

## 2020-08-15 LAB — CBC AND DIFFERENTIAL
HCT: 38 — AB (ref 41–53)
Hemoglobin: 13 — AB (ref 13.5–17.5)
Platelets: 229 (ref 150–399)
WBC: 5.5

## 2020-08-15 LAB — HEMOGLOBIN A1C: Hemoglobin A1C: 4.7

## 2020-08-15 LAB — VITAMIN B12: Vitamin B-12: 730

## 2020-09-18 ENCOUNTER — Ambulatory Visit
Admission: RE | Admit: 2020-09-18 | Discharge: 2020-09-18 | Disposition: A | Discharge status: Home / Self Care | Payer: 59 | Attending: Family Medicine | Admitting: Family Medicine

## 2020-09-18 ENCOUNTER — Encounter: Payer: Self-pay | Admitting: Family Medicine

## 2020-09-18 ENCOUNTER — Ambulatory Visit (INDEPENDENT_AMBULATORY_CARE_PROVIDER_SITE_OTHER): Payer: 59 | Admitting: Family Medicine

## 2020-09-18 ENCOUNTER — Ambulatory Visit
Admission: RE | Admit: 2020-09-18 | Discharge: 2020-09-18 | Disposition: A | Discharge status: Home / Self Care | Payer: 59 | Source: Ambulatory Visit | Attending: Family Medicine | Admitting: Family Medicine

## 2020-09-18 ENCOUNTER — Other Ambulatory Visit: Payer: Self-pay

## 2020-09-18 VITALS — BP 130/100 | HR 80 | Ht 72.0 in | Wt 283.0 lb

## 2020-09-18 DIAGNOSIS — J324 Chronic pansinusitis: Secondary | ICD-10-CM | POA: Insufficient documentation

## 2020-09-18 DIAGNOSIS — Z7689 Persons encountering health services in other specified circumstances: Secondary | ICD-10-CM

## 2020-09-18 DIAGNOSIS — R030 Elevated blood-pressure reading, without diagnosis of hypertension: Secondary | ICD-10-CM

## 2020-09-18 MED ORDER — DOXYCYCLINE HYCLATE 100 MG PO TABS: 100.0000 mg | ORAL_TABLET | Freq: Two times a day (BID) | ORAL | 0 refills | Status: DC

## 2020-09-18 NOTE — Progress Notes (Signed)
Date:  09/18/2020   Name:  Alejandro Winters   DOB:  29-May-1960   MRN:  [id number]   Chief Complaint: Sinusitis (Having reoccurring sinus infections. Using CPAP- getting up in mornings with congestion)  Sinusitis This is a chronic problem. The current episode started more than 1 month ago. The problem has been waxing and waning since onset. There has been no fever. The pain is moderate. Associated symptoms include congestion, headaches, sinus pressure and a sore throat. Pertinent negatives include no chills, coughing, diaphoresis, ear pain, hoarse voice, neck pain, shortness of breath, sneezing or swollen glands. (Right/Frontal and Maxillary discomfort) Past treatments include acetaminophen. The treatment provided no relief.   Lab Results  Component Value Date   CREATININE 1.53 (H) 11/02/2018   BUN 20 11/02/2018   NA 142 11/02/2018   K 3.8 11/02/2018   CL 103 11/02/2018   CO2 24 11/02/2018   Lab Results  Component Value Date   CHOL 154 11/02/2018   HDL 31 (L) 11/02/2018   LDLCALC 84 11/02/2018   TRIG 234 (H) 11/02/2018   CHOLHDL 5.0 11/02/2018   No results found for: TSH Lab Results  Component Value Date   HGBA1C 4.7 (L) 08/22/2017   Lab Results  Component Value Date   WBC 6.6 09/24/2016   HGB 12.5 (L) 09/24/2016   HCT 38.2 09/24/2016   MCV 98 (H) 09/24/2016   PLT 257 09/24/2016   No results found for: ALT, AST, GGT, ALKPHOS, BILITOT   Review of Systems  Constitutional:  Negative for chills, diaphoresis and fever.  HENT:  Positive for congestion, sinus pressure and sore throat. Negative for drooling, ear discharge, ear pain, hoarse voice and sneezing.   Respiratory:  Negative for cough, shortness of breath and wheezing.   Cardiovascular:  Negative for chest pain, palpitations and leg swelling.  Gastrointestinal:  Negative for abdominal pain, blood in stool, constipation, diarrhea and nausea.  Endocrine: Negative for polydipsia.  Genitourinary:  Negative for dysuria,  frequency, hematuria and urgency.  Musculoskeletal:  Negative for back pain, myalgias and neck pain.  Skin:  Negative for rash.  Allergic/Immunologic: Negative for environmental allergies.  Neurological:  Positive for headaches. Negative for dizziness.  Hematological:  Does not bruise/bleed easily.  Psychiatric/Behavioral:  Negative for suicidal ideas. The patient is not nervous/anxious.    Patient Active Problem List   Diagnosis Date Noted   Mixed hyperlipidemia 08/22/2017   Essential hypertension 09/24/2016   CKD (chronic kidney disease) stage 3, GFR 30-59 ml/min (HCC) 09/24/2016    No Known Allergies  Past Surgical History:  Procedure Laterality Date   COLONOSCOPY WITH PROPOFOL N/A 12/16/2014   Procedure: COLONOSCOPY WITH PROPOFOL;  Surgeon: Josefine Class, MD;  Location: [facility] ENDOSCOPY;  Service: Endoscopy;  Laterality: N/A;    Social History   Tobacco Use   Smoking status: Former    Packs/day: 0.50    Years: 1.00    Pack years: 0.50    Types: Cigarettes    Quit date: 10/28/1998    Years since quitting: 21.9   Smokeless tobacco: Never  Substance Use Topics   Alcohol use: Yes    Alcohol/week: 0.0 standard drinks   Drug use: Yes    Types: Marijuana    Comment: marijuana     Medication list has been reviewed and updated.  Current Meds  Medication Sig   Apple Cider Vinegar 300 MG TABS Take by mouth.   hydrochlorothiazide (HYDRODIURIL) 25 MG tablet Take 1 tablet (25 mg total) by  mouth daily.   lisinopril (ZESTRIL) 20 MG tablet One a day   Multiple Vitamin (ONE-A-DAY MENS PO) Take 1 tablet by mouth daily.   pravastatin (PRAVACHOL) 40 MG tablet Take 1 tablet (40 mg total) by mouth daily.   sertraline (ZOLOFT) 50 MG tablet TAKE 1 TABLET BY MOUTH DAILY   tamsulosin (FLOMAX) 0.4 MG CAPS capsule Take 1 capsule (0.4 mg total) by mouth daily. Dr Erlene Quan prescribes (Patient taking differently: Take 0.8 mg by mouth daily.)   [DISCONTINUED] Coenzyme Q10 10 MG capsule  Take by mouth.    PHQ 2/9 Scores 09/18/2020 11/02/2018 04/26/2018 08/22/2017  PHQ - 2 Score 0 0 0 0  PHQ- 9 Score - 1 3 2     GAD 7 : Generalized Anxiety Score 09/18/2020  Nervous, Anxious, on Edge 0  Control/stop worrying 0  Worry too much - different things 0  Trouble relaxing 0  Restless 0  Easily annoyed or irritable 0  Afraid - awful might happen 0  Total GAD 7 Score 0    BP Readings from Last 3 Encounters:  09/18/20 (!) 130/100  11/02/18 120/82  10/27/18 (!) 143/87    Physical Exam Vitals and nursing note reviewed.  HENT:     Head: Normocephalic.     Right Ear: Tympanic membrane, ear canal and external ear normal. There is no impacted cerumen.     Left Ear: Tympanic membrane, ear canal and external ear normal. There is no impacted cerumen.     Nose: Nose normal. No congestion or rhinorrhea.     Mouth/Throat:     Pharynx: No oropharyngeal exudate or posterior oropharyngeal erythema.  Eyes:     General: No scleral icterus.       Right eye: No discharge.        Left eye: No discharge.     Extraocular Movements: Extraocular movements intact.     Conjunctiva/sclera: Conjunctivae normal.     Pupils: Pupils are equal, round, and reactive to light.  Neck:     Thyroid: No thyromegaly.     Vascular: No JVD.     Trachea: No tracheal deviation.  Cardiovascular:     Rate and Rhythm: Normal rate and regular rhythm.     Heart sounds: Normal heart sounds. No murmur heard.   No friction rub. No gallop.  Pulmonary:     Effort: No respiratory distress.     Breath sounds: Normal breath sounds. No wheezing, rhonchi or rales.  Chest:     Chest wall: No tenderness.  Abdominal:     General: Bowel sounds are normal.     Palpations: Abdomen is soft. There is no mass.     Tenderness: There is no abdominal tenderness. There is no guarding or rebound.  Musculoskeletal:        General: No tenderness. Normal range of motion.     Cervical back: Normal range of motion and neck supple.   Lymphadenopathy:     Cervical: No cervical adenopathy.  Skin:    General: Skin is warm.     Findings: No rash.  Neurological:     Mental Status: He is alert and oriented to person, place, and time.     Cranial Nerves: No cranial nerve deficit.     Deep Tendon Reflexes: Reflexes are normal and symmetric.    Wt Readings from Last 3 Encounters:  09/18/20 283 lb (128.4 kg)  11/02/18 300 lb (136.1 kg)  10/27/18 300 lb (136.1 kg)    BP (!) 130/100  Pulse 80   Ht 6' (1.829 m)   Wt 283 lb (128.4 kg)   BMI 38.38 kg/m   Assessment and Plan:  1. Encounter to establish care Patient returns to reestablish after a year hiatus with an insurance induced change.  Patient presents because of persistence of sinus concerns.  2. Chronic pansinusitis Chronic.  Been going on for years.  Stable.  Currently acutely involved with tenderness of the right frontal and right maxillary.  We will initiate doxycycline 100 mg twice a day. - DG Sinuses Complete; Future - doxycycline (VIBRA-TABS) 100 MG tablet; Take 1 tablet (100 mg total) by mouth 2 (two) times daily.  Dispense: 20 tablet; Refill: 0  3. Elevated blood pressure reading Blood pressure is elevated today we will have patient return in 4 weeks for recheck.

## 2020-09-19 ENCOUNTER — Other Ambulatory Visit: Payer: Self-pay

## 2020-09-19 DIAGNOSIS — J324 Chronic pansinusitis: Secondary | ICD-10-CM

## 2020-09-19 NOTE — Progress Notes (Signed)
Ref placed to ENT

## 2020-09-22 ENCOUNTER — Ambulatory Visit: Payer: BLUE CROSS/BLUE SHIELD | Admitting: Family Medicine

## 2020-10-13 ENCOUNTER — Telehealth: Payer: Self-pay

## 2020-10-13 NOTE — Telephone Encounter (Signed)
Copied from [place] [id number]. Topic: General - Other >> Oct 13, 2020 12:02 PM Tessa Lerner A wrote: Reason for CRM: Patient would like the assistance of the practice in receiving lab results from  08/14/20  The labs were ordered by a different Prov's office and the patient is having a difficult time getting their results submitted to [place]  The patient would like to discuss this further with staff member Baxter Flattery when possible

## 2020-10-14 ENCOUNTER — Telehealth: Payer: Self-pay

## 2020-10-14 NOTE — Telephone Encounter (Signed)
Copied from [place] [id number]. Topic: General - Other >> Oct 14, 2020 11:06 AM Alanda Slim E wrote: Reason for CRM: Pt called to speak with Baxter Flattery about lab results / please advise

## 2021-01-06 ENCOUNTER — Other Ambulatory Visit: Payer: Self-pay | Admitting: Family Medicine

## 2021-01-06 DIAGNOSIS — N401 Enlarged prostate with lower urinary tract symptoms: Secondary | ICD-10-CM

## 2021-01-06 DIAGNOSIS — F324 Major depressive disorder, single episode, in partial remission: Secondary | ICD-10-CM

## 2021-01-06 DIAGNOSIS — I5032 Chronic diastolic (congestive) heart failure: Secondary | ICD-10-CM

## 2021-01-06 DIAGNOSIS — R351 Nocturia: Secondary | ICD-10-CM

## 2021-01-06 DIAGNOSIS — I10 Essential (primary) hypertension: Secondary | ICD-10-CM

## 2021-01-06 DIAGNOSIS — E782 Mixed hyperlipidemia: Secondary | ICD-10-CM

## 2021-01-06 NOTE — Telephone Encounter (Signed)
Medication Refill - Medication: Lisinopril, pravastatin, sertraline, tamsolusin, Hydrochlorothiazide.   Has the patient contacted their pharmacy? Yes.   Pt called stating that he contacted the pharmacy and that they state he has no refills left. Pt is completely out of medications. Please advise.  (Agent: If no, request that the patient contact the pharmacy for the refill. If patient does not wish to contact the pharmacy document the reason why and proceed with request.) (Agent: If yes, when and what did the pharmacy advise?)  Preferred Pharmacy (with phone number or street name):  [facility], [address] AT [address]  [place] [address]  Phone: [phone] Fax: [phone]  Hours: Not open 24 hours   Has the patient been seen for an appointment in the last year OR does the patient have an upcoming appointment? Yes.    Agent: Please be advised that RX refills may take up to 3 business days. We ask that you follow-up with your pharmacy.

## 2021-01-07 MED ORDER — SERTRALINE HCL 50 MG PO TABS: ORAL_TABLET | ORAL | 0 refills | Status: DC

## 2021-01-07 MED ORDER — LISINOPRIL 20 MG PO TABS: ORAL_TABLET | ORAL | 0 refills | Status: DC

## 2021-01-07 MED ORDER — PRAVASTATIN SODIUM 40 MG PO TABS: 40.0000 mg | ORAL_TABLET | Freq: Every day | ORAL | 0 refills | Status: DC

## 2021-01-07 MED ORDER — HYDROCHLOROTHIAZIDE 25 MG PO TABS: 25.0000 mg | ORAL_TABLET | Freq: Every day | ORAL | 0 refills | Status: DC

## 2021-01-07 NOTE — Telephone Encounter (Signed)
Requested medication (s) are due for refill today:  Yes for all 5  Requested medication (s) are on the active medication list:   Yes for all 5  Future visit scheduled:   No   Last seen 3 mo ago to re establish caare   Last ordered: 11/02/2018 #90, 1 refill for all 5  Returned because no refills remaining.   2020 last time refilled   Requested Prescriptions  Pending Prescriptions Disp Refills   lisinopril (ZESTRIL) 20 MG tablet 90 tablet 1    Sig: One a day     Cardiovascular:  ACE Inhibitors Failed - 01/06/2021 10:29 AM      Failed - Cr in normal range and within 180 days    Creatinine  Date Value Ref Range Status  08/15/2020 1.4 (A) 0.6 - 1.3 Final   Creatinine, Ser  Date Value Ref Range Status  11/02/2018 1.53 (H) 0.76 - 1.27 mg/dL Final          Failed - Last BP in normal range    BP Readings from Last 1 Encounters:  09/18/20 (!) 130/100          Passed - K in normal range and within 180 days    Potassium  Date Value Ref Range Status  08/15/2020 3.8 3.4 - 5.3 Final          Passed - Patient is not pregnant      Passed - Valid encounter within last 6 months    Recent Outpatient Visits           3 months ago Encounter to establish care   [facility] Juline Patch, MD   2 years ago Essential hypertension   [place], Deanna C, MD   2 years ago Essential hypertension   [place], Deanna C, MD   3 years ago Essential hypertension   [facility] Juline Patch, MD   4 years ago CKD (chronic kidney disease) stage 3, GFR 30-59 ml/min   [facility] Juline Patch, MD               pravastatin (PRAVACHOL) 40 MG tablet 90 tablet 1    Sig: Take 1 tablet (40 mg total) by mouth daily.     Cardiovascular:  Antilipid - Statins Failed - 01/06/2021 10:29 AM      Failed - Total Cholesterol in normal range and within 360 days    Cholesterol, Total  Date Value Ref Range Status  11/02/2018 154 100 -  199 mg/dL Final          Failed - LDL in normal range and within 360 days    LDL Chol Calc (NIH)  Date Value Ref Range Status  11/02/2018 84 0 - 99 mg/dL Final          Failed - HDL in normal range and within 360 days    HDL  Date Value Ref Range Status  11/02/2018 31 (L) >39 mg/dL Final          Failed - Triglycerides in normal range and within 360 days    Triglycerides  Date Value Ref Range Status  11/02/2018 234 (H) 0 - 149 mg/dL Final          Passed - Patient is not pregnant      Passed - Valid encounter within last 12 months    Recent Outpatient Visits  3 months ago Encounter to establish care   [facility] Juline Patch, MD   2 years ago Essential hypertension   [place], Deanna C, MD   2 years ago Essential hypertension   [facility] Juline Patch, MD   3 years ago Essential hypertension   [facility] Juline Patch, MD   4 years ago CKD (chronic kidney disease) stage 3, GFR 30-59 ml/min   [facility] Juline Patch, MD               hydrochlorothiazide (HYDRODIURIL) 25 MG tablet 90 tablet 1    Sig: Take 1 tablet (25 mg total) by mouth daily.     Cardiovascular: Diuretics - Thiazide Failed - 01/06/2021 10:29 AM      Failed - Cr in normal range and within 360 days    Creatinine  Date Value Ref Range Status  08/15/2020 1.4 (A) 0.6 - 1.3 Final   Creatinine, Ser  Date Value Ref Range Status  11/02/2018 1.53 (H) 0.76 - 1.27 mg/dL Final          Failed - Last BP in normal range    BP Readings from Last 1 Encounters:  09/18/20 (!) 130/100          Passed - Ca in normal range and within 360 days    Calcium  Date Value Ref Range Status  08/15/2020 10.0 8.7 - 10.7 Final          Passed - K in normal range and within 360 days    Potassium  Date Value Ref Range Status  08/15/2020 3.8 3.4 - 5.3 Final          Passed - Na in normal range and within 360 days    Sodium   Date Value Ref Range Status  08/15/2020 142 137 - 147 Final          Passed - Valid encounter within last 6 months    Recent Outpatient Visits           3 months ago Encounter to establish care   [facility] Juline Patch, MD   2 years ago Essential hypertension   [place], Deanna C, MD   2 years ago Essential hypertension   [place], Deanna C, MD   3 years ago Essential hypertension   [facility] Juline Patch, MD   4 years ago CKD (chronic kidney disease) stage 3, GFR 30-59 ml/min   [facility] Juline Patch, MD               sertraline (ZOLOFT) 50 MG tablet 90 tablet 1    Sig: TAKE 1 TABLET BY MOUTH DAILY     Psychiatry:  Antidepressants - SSRI Passed - 01/06/2021 10:29 AM      Passed - Valid encounter within last 6 months    Recent Outpatient Visits           3 months ago Encounter to establish care   [facility] Juline Patch, MD   2 years ago Essential hypertension   [place], Deanna C, MD   2 years ago Essential hypertension   [place], Deanna C, MD   3 years ago Essential hypertension   [place], Deanna C, MD   4 years ago CKD (chronic kidney disease) stage 3, GFR 30-59 ml/min  [place], MD               tamsulosin (FLOMAX) 0.4 MG CAPS capsule 90 capsule 2    Sig: Take 1 capsule (0.4 mg total) by mouth daily. Dr Erlene Quan prescribes     Urology: Alpha-Adrenergic Blocker Failed - 01/06/2021 10:29 AM      Failed - Last BP in normal range    BP Readings from Last 1 Encounters:  09/18/20 (!) 130/100          Passed - Valid encounter within last 12 months    Recent Outpatient Visits           3 months ago Encounter to establish care   [facility] Juline Patch, MD   2 years ago Essential hypertension   [facility] Juline Patch, MD   2 years ago  Essential hypertension   [place], Deanna C, MD   3 years ago Essential hypertension   [facility] Juline Patch, MD   4 years ago CKD (chronic kidney disease) stage 3, GFR 30-59 ml/min   [facility] Juline Patch, MD

## 2021-01-13 ENCOUNTER — Encounter: Payer: Self-pay | Admitting: Family Medicine

## 2021-01-13 ENCOUNTER — Ambulatory Visit (INDEPENDENT_AMBULATORY_CARE_PROVIDER_SITE_OTHER): Payer: 59 | Admitting: Family Medicine

## 2021-01-13 ENCOUNTER — Other Ambulatory Visit: Payer: Self-pay

## 2021-01-13 VITALS — BP 130/78 | HR 72 | Ht 72.0 in | Wt 290.0 lb

## 2021-01-13 DIAGNOSIS — Z23 Encounter for immunization: Secondary | ICD-10-CM | POA: Diagnosis not present

## 2021-01-13 DIAGNOSIS — R972 Elevated prostate specific antigen [PSA]: Secondary | ICD-10-CM | POA: Insufficient documentation

## 2021-01-13 DIAGNOSIS — E782 Mixed hyperlipidemia: Secondary | ICD-10-CM | POA: Diagnosis not present

## 2021-01-13 DIAGNOSIS — R350 Frequency of micturition: Secondary | ICD-10-CM

## 2021-01-13 DIAGNOSIS — F324 Major depressive disorder, single episode, in partial remission: Secondary | ICD-10-CM | POA: Diagnosis not present

## 2021-01-13 DIAGNOSIS — I10 Essential (primary) hypertension: Secondary | ICD-10-CM | POA: Diagnosis not present

## 2021-01-13 DIAGNOSIS — I5032 Chronic diastolic (congestive) heart failure: Secondary | ICD-10-CM | POA: Diagnosis not present

## 2021-01-13 DIAGNOSIS — N183 Chronic kidney disease, stage 3 unspecified: Secondary | ICD-10-CM

## 2021-01-13 DIAGNOSIS — N401 Enlarged prostate with lower urinary tract symptoms: Secondary | ICD-10-CM

## 2021-01-13 DIAGNOSIS — N529 Male erectile dysfunction, unspecified: Secondary | ICD-10-CM

## 2021-01-13 HISTORY — DX: Elevated prostate specific antigen (PSA): R97.20

## 2021-01-13 MED ORDER — PRAVASTATIN SODIUM 40 MG PO TABS: 40.0000 mg | ORAL_TABLET | Freq: Every day | ORAL | 1 refills | Status: DC

## 2021-01-13 MED ORDER — SERTRALINE HCL 50 MG PO TABS: ORAL_TABLET | ORAL | 1 refills | Status: DC

## 2021-01-13 MED ORDER — LISINOPRIL 20 MG PO TABS: ORAL_TABLET | ORAL | 1 refills | Status: DC

## 2021-01-13 MED ORDER — HYDROCHLOROTHIAZIDE 25 MG PO TABS
25.0000 mg | ORAL_TABLET | Freq: Every day | ORAL | 1 refills | Status: DC
Start: 2021-01-13 — End: 2021-10-13

## 2021-01-13 NOTE — Progress Notes (Signed)
Date:  01/13/2021   Name:  Alejandro Winters   DOB:  10/08/1960   MRN:  [id number]   Chief Complaint: Hypertension and Depression  HPI  Lab Results  Component Value Date   CREATININE 1.4 (A) 08/15/2020   BUN 16 08/15/2020   NA 142 08/15/2020   K 3.8 08/15/2020   CL 102 08/15/2020   CO2 24 11/02/2018   Lab Results  Component Value Date   CHOL 154 11/02/2018   HDL 31 (L) 11/02/2018   LDLCALC 84 11/02/2018   TRIG 234 (H) 11/02/2018   CHOLHDL 5.0 11/02/2018   Lab Results  Component Value Date   TSH 2.05 08/15/2020   Lab Results  Component Value Date   HGBA1C 4.7 08/15/2020   Lab Results  Component Value Date   WBC 5.5 08/15/2020   HGB 13.0 (A) 08/15/2020   HCT 38 (A) 08/15/2020   MCV 98 (H) 09/24/2016   PLT 229 08/15/2020   Lab Results  Component Value Date   ALT 13 08/15/2020   AST 13 (A) 08/15/2020   ALKPHOS 59 08/15/2020     Review of Systems  Patient Active Problem List   Diagnosis Date Noted   Major depressive disorder in partial remission ([place]) 01/13/2021   Elevated PSA 01/13/2021   Mixed hyperlipidemia 08/22/2017   Essential hypertension 09/24/2016   CKD (chronic kidney disease) stage 3, GFR 30-59 ml/min (HCC) 09/24/2016    No Known Allergies  Past Surgical History:  Procedure Laterality Date   COLONOSCOPY WITH PROPOFOL N/A 12/16/2014   Procedure: COLONOSCOPY WITH PROPOFOL;  Surgeon: Josefine Class, MD;  Location: [facility] ENDOSCOPY;  Service: Endoscopy;  Laterality: N/A;    Social History   Tobacco Use   Smoking status: Former    Packs/day: 0.50    Years: 1.00    Pack years: 0.50    Types: Cigarettes    Quit date: 10/28/1998    Years since quitting: 22.2   Smokeless tobacco: Never  Substance Use Topics   Alcohol use: Yes    Alcohol/week: 0.0 standard drinks   Drug use: Yes    Types: Marijuana    Comment: marijuana     Medication list has been reviewed and updated.  Current Meds  Medication Sig   Apple Cider Vinegar 300  MG TABS Take by mouth.   hydrochlorothiazide (HYDRODIURIL) 25 MG tablet Take 1 tablet (25 mg total) by mouth daily.   lisinopril (ZESTRIL) 20 MG tablet One a day   Multiple Vitamin (ONE-A-DAY MENS PO) Take 1 tablet by mouth daily.   pravastatin (PRAVACHOL) 40 MG tablet Take 1 tablet (40 mg total) by mouth daily.   sertraline (ZOLOFT) 50 MG tablet TAKE 1 TABLET BY MOUTH DAILY    PHQ 2/9 Scores 01/13/2021 09/18/2020 11/02/2018 04/26/2018  PHQ - 2 Score 1 0 0 0  PHQ- 9 Score 8 - 1 3    GAD 7 : Generalized Anxiety Score 01/13/2021 09/18/2020  Nervous, Anxious, on Edge 1 0  Control/stop worrying 1 0  Worry too much - different things 1 0  Trouble relaxing 0 0  Restless 0 0  Easily annoyed or irritable 1 0  Afraid - awful might happen 1 0  Total GAD 7 Score 5 0  Anxiety Difficulty Not difficult at all -    BP Readings from Last 3 Encounters:  01/13/21 130/78  09/18/20 (!) 130/100  11/02/18 120/82    Physical Exam Vitals and nursing note reviewed.  HENT:  Head: Normocephalic.     Right Ear: Tympanic membrane and external ear normal.     Left Ear: Tympanic membrane and external ear normal.     Nose: Nose normal.  Eyes:     General: No scleral icterus.       Right eye: No discharge.        Left eye: No discharge.     Conjunctiva/sclera: Conjunctivae normal.     Pupils: Pupils are equal, round, and reactive to light.  Neck:     Thyroid: No thyromegaly.     Vascular: No JVD.     Trachea: No tracheal deviation.  Cardiovascular:     Rate and Rhythm: Normal rate and regular rhythm.     Pulses: Normal pulses.     Heart sounds: Normal heart sounds, S1 normal and S2 normal. No murmur heard. No systolic murmur is present.  No diastolic murmur is present.    No friction rub. No gallop. No S3 or S4 sounds.  Pulmonary:     Effort: No respiratory distress.     Breath sounds: Normal breath sounds. No wheezing or rales.  Abdominal:     General: Bowel sounds are normal.      Palpations: Abdomen is soft. There is no mass.     Tenderness: There is no abdominal tenderness. There is no guarding or rebound.  Genitourinary:    Prostate: Enlarged. Not tender and no nodules present.     Rectum: Normal. Guaiac result negative. No mass.  Musculoskeletal:        General: No tenderness. Normal range of motion.     Cervical back: Normal range of motion and neck supple.  Lymphadenopathy:     Cervical: No cervical adenopathy.  Skin:    General: Skin is warm.     Findings: No rash.  Neurological:     Mental Status: He is alert and oriented to person, place, and time.     Cranial Nerves: No cranial nerve deficit.     Deep Tendon Reflexes: Reflexes are normal and symmetric.    Wt Readings from Last 3 Encounters:  01/13/21 290 lb (131.5 kg)  09/18/20 283 lb (128.4 kg)  11/02/18 300 lb (136.1 kg)    BP 130/78   Pulse 72   Ht 6' (1.829 m)   Wt 290 lb (131.5 kg)   SpO2 97%   BMI 39.33 kg/m   Assessment and Plan:  1. Essential hypertension Chronic.  Controlled.  Stable.  Blood pressure today is noted to be 130/78.  We will continue hydrochlorothiazide 25 mg and lisinopril 20 mg once a day.  Will check renal function panel for current level of electrolytes and GFR. - hydrochlorothiazide (HYDRODIURIL) 25 MG tablet; Take 1 tablet (25 mg total) by mouth daily.  Dispense: 90 tablet; Refill: 1 - lisinopril (ZESTRIL) 20 MG tablet; One a day  Dispense: 90 tablet; Refill: 1 - Renal Function Panel  2. Mixed hyperlipidemia Chronic.  Controlled.  Stable.  Continue pravastatin 40 mg once a day.  Will check lipid panel for current LDL control - pravastatin (PRAVACHOL) 40 MG tablet; Take 1 tablet (40 mg total) by mouth daily.  Dispense: 90 tablet; Refill: 1 - Lipid Panel With LDL/HDL Ratio  3. Chronic diastolic congestive heart failure (HCC) Chronic.  Controlled.  Stable.  Continue continue with hydrochlorothiazide 25 mg and lisinopril 20 mg once a day. - hydrochlorothiazide  (HYDRODIURIL) 25 MG tablet; Take 1 tablet (25 mg total) by mouth daily.  Dispense: 90 tablet;  Refill: 1 - lisinopril (ZESTRIL) 20 MG tablet; One a day  Dispense: 90 tablet; Refill: 1  4. Major depressive disorder in partial remission, unspecified whether recurrent (HCC) Chronic.  Controlled.  Stable.  Gad score is 5 and PHQ score is 9.  Continue current dosing of sertraline 50 mg and will recheck in 6 weeks upon return for evaluation of other concerns. - sertraline (ZOLOFT) 50 MG tablet; TAKE 1 TABLET BY MOUTH DAILY  Dispense: 90 tablet; Refill: 1  5. Elevated PSA Patient with history of elevated PSAs which has not followed up with urology.  We will check current level of PSA and will likely return to evaluation with urology. - PSA  6. Benign prostatic hyperplasia with urinary frequency Referrals have been made back with urology and mirabegron for elevated PSA and symptoms of BPH. - PSA - Ambulatory referral to Urology  7. Stage 3 chronic kidney disease, unspecified whether stage 3a or 3b CKD (HCC) Chronic.  Controlled with controlling of atherosclerotic concerns of lipid management with simvastatin and blood pressure control.  8. Erectile dysfunction, unspecified erectile dysfunction type New onset.  Patient has more erectile dysfunction.  We will check his current testosterone free and total and we will also bring it to the attention of urology if insufficient. - Testosterone,Free and Total - Ambulatory referral to Urology  9. Need for immunization against influenza Discussed and administered - Flu Vaccine QUAD 65mo+IM (Fluarix, Fluzone & Alfiuria Quad PF)

## 2021-01-15 LAB — RENAL FUNCTION PANEL
Albumin: 4.8 g/dL (ref 3.8–4.8)
BUN/Creatinine Ratio: 13 (ref 10–24)
BUN: 19 mg/dL (ref 8–27)
CO2: 24 mmol/L (ref 20–29)
Calcium: 10.1 mg/dL (ref 8.6–10.2)
Chloride: 104 mmol/L (ref 96–106)
Creatinine, Ser: 1.51 mg/dL — ABNORMAL HIGH (ref 0.76–1.27)
Glucose: 109 mg/dL — ABNORMAL HIGH (ref 70–99)
Phosphorus: 3.6 mg/dL (ref 2.8–4.1)
Potassium: 4 mmol/L (ref 3.5–5.2)
Sodium: 147 mmol/L — ABNORMAL HIGH (ref 134–144)
eGFR: 52 mL/min/{1.73_m2} — ABNORMAL LOW (ref >59)

## 2021-01-15 LAB — LIPID PANEL WITH LDL/HDL RATIO
Cholesterol, Total: 160 mg/dL (ref 100–199)
HDL: 32 mg/dL — ABNORMAL LOW (ref >39)
LDL Chol Calc (NIH): 97 mg/dL (ref 0–99)
LDL/HDL Ratio: 3 ratio (ref 0.0–3.6)
Triglycerides: 178 mg/dL — ABNORMAL HIGH (ref 0–149)
VLDL Cholesterol Cal: 31 mg/dL (ref 5–40)

## 2021-01-15 LAB — TESTOSTERONE,FREE AND TOTAL
Testosterone, Free: 8.6 pg/mL (ref 6.6–18.1)
Testosterone: 219 ng/dL — ABNORMAL LOW (ref 264–916)

## 2021-01-15 LAB — PSA: Prostate Specific Ag, Serum: 5.8 ng/mL — ABNORMAL HIGH (ref 0.0–4.0)

## 2021-01-29 ENCOUNTER — Other Ambulatory Visit: Payer: Self-pay | Admitting: Family Medicine

## 2021-01-29 DIAGNOSIS — R351 Nocturia: Secondary | ICD-10-CM

## 2021-01-29 DIAGNOSIS — N401 Enlarged prostate with lower urinary tract symptoms: Secondary | ICD-10-CM

## 2021-01-29 NOTE — Telephone Encounter (Signed)
Medication Refill - Medication:  tamsulosin (FLOMAX) 0.4 MG CAPS capsule   Has the patient contacted their pharmacy? Yes.   Contact PCP-Pharmacy could not find medication under his chart  Preferred Pharmacy (with phone number or street name):  [facility] [place], [facility] [address]  [place], [place] [address]  Phone:  [phone]  Fax:  [phone]  Has the patient been seen for an appointment in the last year OR does the patient have an upcoming appointment? Yes.    Agent: Please be advised that RX refills may take up to 3 business days. We ask that you follow-up with your pharmacy.

## 2021-01-31 MED ORDER — TAMSULOSIN HCL 0.4 MG PO CAPS: 0.4000 mg | ORAL_CAPSULE | Freq: Every day | ORAL | 2 refills | Status: DC

## 2021-01-31 NOTE — Telephone Encounter (Signed)
Requested Prescriptions  Pending Prescriptions Disp Refills  . tamsulosin (FLOMAX) 0.4 MG CAPS capsule 90 capsule 2    Sig: Take 1 capsule (0.4 mg total) by mouth daily. Dr Erlene Quan prescribes     Urology: Alpha-Adrenergic Blocker Passed - 01/30/2021  3:38 PM      Passed - Last BP in normal range    BP Readings from Last 1 Encounters:  01/13/21 130/78         Passed - Valid encounter within last 12 months    Recent Outpatient Visits          2 weeks ago Essential hypertension   [place], Deanna C, MD   4 months ago Encounter to establish care   [facility] Juline Patch, MD   2 years ago Essential hypertension   [place], Deanna C, MD   2 years ago Essential hypertension   [place], Deanna C, MD   3 years ago Essential hypertension   [place], Deanna C, MD      Future Appointments            In 1 week Hollice Espy, MD [place]   In 3 weeks Juline Patch, MD [facility]           \

## 2021-02-06 ENCOUNTER — Ambulatory Visit: Payer: 59 | Admitting: Urology

## 2021-02-12 NOTE — Progress Notes (Signed)
02/13/21 10:24 AM   Alejandro Winters 01-01-1961 [id number]  Referring provider:  Juline Patch, MD [address] [place],  [place] [address] Chief Complaint  Patient presents with   Erectile Dysfunction     HPI: Alejandro Winters is a 60 y.o.male with a personal history of BPH with obstructive urinary symptoms, CKD, and elevated PSA, who presents today for further evaluation of erectile dysfunction. He was last seen in 2020.    His PSA on 01/14/2021 has risen to 5.8 it was previously 3.48 on 10/24/2020.   He also recently had a testosterone which was 219 on 01/14/2021.  He does report issues with energy, changes in his body as well as erectile issues as outlined below.  Adam scores below.  He is currently managed on Flomax.  Urinary symptoms are fairly stable, IPSS is below.  He reports that over the course of about 10 years, has had a decline in his overall erections both in the ability to maintain and achieve them.  He is tried Viagra in the very remote past and did not find it to be particularly effective.  At the time, he was also smoking marijuana regularly and since quit.  After he stopped, his symptoms improved.   IPSS     Row Name 02/13/21 1000         International Prostate Symptom Score   How often have you had the sensation of not emptying your bladder? More than half the time     How often have you had to urinate less than every two hours? Less than half the time     How often have you found you stopped and started again several times when you urinated? Less than half the time     How often have you found it difficult to postpone urination? Less than 1 in 5 times     How often have you had a weak urinary stream? Less than 1 in 5 times     How often have you had to strain to start urination? Less than 1 in 5 times     How many times did you typically get up at night to urinate? 1 Time     Total IPSS Score 12       Quality of Life due to urinary symptoms    If you were to spend the rest of your life with your urinary condition just the way it is now how would you feel about that? Mostly Satisfied              Score:  1-7 Mild 8-19 Moderate 20-35 Severe   SHIM     Row Name 02/13/21 0949         SHIM: Over the last 6 months:   How do you rate your confidence that you could get and keep an erection? Very Low     When you had erections with sexual stimulation, how often were your erections hard enough for penetration (entering your partner)? Almost Never or Never     During sexual intercourse, how often were you able to maintain your erection after you had penetrated (entered) your partner? Almost Never or Never     During sexual intercourse, how difficult was it to maintain your erection to completion of intercourse? Extremely Difficult     When you attempted sexual intercourse, how often was it satisfactory for you? Almost Never or Never       SHIM Total Score   SHIM  5              Androgen Deficiency in the Aging Male     [name] 02/13/21 0900         Androgen Deficiency in the Aging Male   Do you have a decrease in libido (sex drive) Yes     Do you have lack of energy Yes     Do you have a decrease in strength and/or endurance Yes     Have you lost height No     Have you noticed a decreased enjoyment of life Yes     Are you sad and/or grumpy No     Are your erections less strong Yes     Have you noticed a recent deterioration in your ability to play sports No     Are you falling asleep after dinner Yes     Has there been a recent deterioration in your work performance No              PMH: Past Medical History:  Diagnosis Date   CHF (congestive heart failure) ([place])    Depression    Hyperlipemia    Hypertension    Kidney failure    Sleep apnea    Vitiligo     Surgical History: Past Surgical History:  Procedure Laterality Date   COLONOSCOPY WITH PROPOFOL N/A 12/16/2014   Procedure: COLONOSCOPY  WITH PROPOFOL;  Surgeon: Alejandro Class, MD;  Location: [facility] ENDOSCOPY;  Service: Endoscopy;  Laterality: N/A;    Home Medications:  Allergies as of 02/13/2021   No Known Allergies      Medication List        Accurate as of February 13, 2021 10:24 AM. If you have any questions, ask your nurse or doctor.          Apple Cider Vinegar 300 MG Tabs Take by mouth.   hydrochlorothiazide 25 MG tablet Commonly known as: HYDRODIURIL Take 1 tablet (25 mg total) by mouth daily.   lisinopril 20 MG tablet Commonly known as: ZESTRIL One a day   ONE-A-DAY MENS PO Take 1 tablet by mouth daily.   pravastatin 40 MG tablet Commonly known as: PRAVACHOL Take 1 tablet (40 mg total) by mouth daily.   sertraline 50 MG tablet Commonly known as: ZOLOFT TAKE 1 TABLET BY MOUTH DAILY   tamsulosin 0.4 MG Caps capsule Commonly known as: FLOMAX Take 1 capsule (0.4 mg total) by mouth daily. Dr Erlene Quan prescribes        Allergies: No Known Allergies  Family History: Family History  Problem Relation Age of Onset   Hypertension Mother    Cancer Father    Hypertension Father    Prostate cancer Neg Hx    Bladder Cancer Neg Hx    Kidney cancer Neg Hx     Social History:  reports that he quit smoking about 22 years ago. His smoking use included cigarettes. He has a 0.50 pack-year smoking history. He has never used smokeless tobacco. He reports current alcohol use. He reports current drug use. Drug: Marijuana.   Physical Exam: BP (!) 150/89    Pulse 80    Ht 6' (1.829 m)    Wt 294 lb (133.4 kg)    BMI 39.87 kg/m   Constitutional:  Alert and oriented, No acute distress. HEENT: [place] AT, moist mucus membranes.  Trachea midline, no masses. Cardiovascular: No clubbing, cyanosis, or edema. Respiratory: Normal respiratory effort, no increased work of breathing. Rectal: Normal  sphincter tone.  30 cc prostate, nontender, no nodules. Skin: No rashes, bruises or suspicious  lesions. Neurologic: Grossly intact, no focal deficits, moving all 4 extremities. Psychiatric: Normal mood and affect.  Laboratory Data:  Lab Results  Component Value Date   CREATININE 1.51 (H) 01/14/2021    Lab Results  Component Value Date   TESTOSTERONE 219 (L) 01/14/2021    Lab Results  Component Value Date   HGBA1C 4.7 08/15/2020   See PSA as above.   Assessment & Plan:    1. Benign prostatic hyperplasia with urinary obstruction Symptoms stable on Flomax, continue this medication  2. Combined arterial insufficiency and corporo-venous occlusive erectile dysfunction We discussed the pathophysiology of erectile dysfunction today along with possible contributing factors. Discussed possible treatment options including PDE 5 inhibitors, vacuum erectile device, intracavernosal injection, MUSE, and placement of the inflatable or malleable penile prosthesis for refractory cases.  In terms of PDE 5 inhibitors, we discussed contraindications for this medication as well as common side effects. Patient was counseled on optimal use. All of his questions were answered in detail.  I would like him to try generic sildenafil again.  He is willing to try.  Prescription sent to [facility].  He is given a coupon.  3. Elevated PSA  We reviewed the implications of an elevated PSA and the uncertainty surrounding it. In general, a man's PSA increases with age and is produced by both normal and cancerous prostate tissue. Differential for elevated PSA is BPH, prostate cancer, infection, recent intercourse/ejaculation, prostate infarction, recent urethroscopic manipulation (foley placement/cystoscopy) and prostatitis. Management of an elevated PSA can include observation or prostate biopsy and wediscussed this in detail. We discussed that indications for prostate biopsy are defined by age and race specific PSA cutoffs as well as a PSA velocity of 0.75/year.  We will plan to repeat his PSA today.  If it  fails to decline, would likely need to proceed with prostate biopsy.  This was also briefly discussed today.  4.  Hypogonadism Likely multifactorial including age, lifestyle, and obesity as contributing factors  He is symptomatic as outlined per HPI however we discussed needing to evaluate further evaluate his elevated PSA to ensure that he does have any underlying prostate issues or prostate cancer.  He is agreeable with this plan.  We did go ahead discussed today risk and benefits of treating hypogonadism either with testosterone supplementation or stimulation of his own testosterone with something like Clomid.  We also discussed that it is possible that he may be able to increase his own testosterone with lifestyle modification, exercise, weight loss to increase the bioavailable circulating testosterone and help decrease the amount of conversion to estrogen.  He prefer a more conservative approach at this time.  He is interested in weight loss.  We discussed some strategies today and encouraged him to talk more about this with his primary care as well.  We discussed the benefits to his overall health as well as his GU health.  Will call with repeat PSA results and plan  Conley Rolls as a scribe for Hollice Espy, MD.,have documented all relevant documentation on the behalf of Hollice Espy, MD,as directed by  Hollice Espy, MD while in the presence of Hollice Espy, MD.  Hollice Espy, MD   [facility] [address] [place], [place] [address] [phone]  I spent 43 total minutes on the day of the encounter including pre-visit review of the medical record, face-to-face time with the patient,  and post visit ordering of labs/imaging/tests.

## 2021-02-13 ENCOUNTER — Other Ambulatory Visit
Admission: RE | Admit: 2021-02-13 | Discharge: 2021-02-13 | Disposition: A | Discharge status: Home / Self Care | Payer: 59 | Attending: Urology | Admitting: Urology

## 2021-02-13 ENCOUNTER — Other Ambulatory Visit: Payer: Self-pay

## 2021-02-13 ENCOUNTER — Ambulatory Visit (INDEPENDENT_AMBULATORY_CARE_PROVIDER_SITE_OTHER): Payer: 59 | Admitting: Urology

## 2021-02-13 ENCOUNTER — Encounter: Payer: Self-pay | Admitting: Urology

## 2021-02-13 VITALS — BP 150/89 | HR 80 | Ht 72.0 in | Wt 294.0 lb

## 2021-02-13 DIAGNOSIS — R972 Elevated prostate specific antigen [PSA]: Secondary | ICD-10-CM | POA: Diagnosis present

## 2021-02-13 DIAGNOSIS — N5203 Combined arterial insufficiency and corporo-venous occlusive erectile dysfunction: Secondary | ICD-10-CM

## 2021-02-13 DIAGNOSIS — E291 Testicular hypofunction: Secondary | ICD-10-CM

## 2021-02-13 DIAGNOSIS — N401 Enlarged prostate with lower urinary tract symptoms: Secondary | ICD-10-CM

## 2021-02-13 DIAGNOSIS — N138 Other obstructive and reflux uropathy: Secondary | ICD-10-CM

## 2021-02-13 LAB — PSA: Prostatic Specific Antigen: 5.74 ng/mL — ABNORMAL HIGH (ref 0.00–4.00)

## 2021-02-13 MED ORDER — SILDENAFIL CITRATE 20 MG PO TABS: ORAL_TABLET | ORAL | 1 refills | Status: DC

## 2021-02-13 NOTE — Patient Instructions (Signed)
Will call with results

## 2021-02-15 ENCOUNTER — Encounter: Payer: Self-pay | Admitting: Urology

## 2021-02-16 ENCOUNTER — Telehealth: Payer: Self-pay

## 2021-02-16 NOTE — Telephone Encounter (Signed)
-----   Message from Hollice Espy, MD sent at 02/16/2021 12:40 PM EST ----- In light of his elevated/rising PSA, I do think it is reasonable to go ahead and proceed with prostate biopsy.  Please review the risk benefits and instructions with him.  I will review them again on the day of the procedure.  If he would like to discuss this further, please make him a virtual visit.  Hollice Espy, MD

## 2021-02-16 NOTE — Telephone Encounter (Signed)
Pt aware. Verbalized understanding. Appointment made

## 2021-02-25 ENCOUNTER — Ambulatory Visit: Payer: 59 | Admitting: Family Medicine

## 2021-03-09 NOTE — Progress Notes (Signed)
° °  03/10/21  CC:  Chief Complaint  Patient presents with   Prostate Biopsy    HPI:  Alejandro Winters is a 61 y.o. male with a personal history of BPH with obstructive urinary symptoms, CKD, and elevated PSA, who presents today for further evaluation of erectile dysfunction, who presents today for prostate biopsy.   His PSA on 01/14/2021 has risen to 5.8 it was previously 3.48 on 10/24/2020.    He also recently had a testosterone which was 219 on 01/14/2021.   His most recent PSA on 02/13/2021 was 5.74.   Rectal exam unremarkable    Vitals:   03/10/21 1526  BP: (!) 154/90  Pulse: (!) 59  NED. A&Ox3.   No respiratory distress   Abd soft, NT, ND Normal external genitalia with patent urethral meatus  Prostate Biopsy Procedure   Informed consent was obtained after discussing risks/benefits of the procedure.  A time out was performed to ensure correct patient identity.  Pre-Procedure: - Last PSA Level:  Component     Latest Ref Rng & Units 02/13/2021  Prostatic Specific Antigen     0.00 - 4.00 ng/mL 5.74 (H)   - Gentamicin given prophylactically - Levaquin 500 mg administered PO -Transrectal Ultrasound performed revealing a 48 gm prostate -No significant hypoechoic or median lobe noted  Procedure: - Prostate block performed using 10 cc 1% lidocaine and biopsies taken from sextant areas, a total of 12 under ultrasound guidance.  Post-Procedure: - Patient tolerated the procedure well - He was counseled to seek immediate medical attention if experiences any severe pain, significant bleeding, or fevers - Return in one week to discuss biopsy results  I,Kailey Littlejohn,acting as a scribe for Hollice Espy, MD.,have documented all relevant documentation on the behalf of Hollice Espy, MD,as directed by  Hollice Espy, MD while in the presence of Hollice Espy, MD.   I have reviewed the above documentation for accuracy and completeness, and I agree with the above.    Hollice Espy, MD

## 2021-03-10 ENCOUNTER — Encounter: Payer: Self-pay | Admitting: Urology

## 2021-03-10 ENCOUNTER — Other Ambulatory Visit: Payer: Self-pay

## 2021-03-10 ENCOUNTER — Ambulatory Visit (INDEPENDENT_AMBULATORY_CARE_PROVIDER_SITE_OTHER): Payer: 59 | Admitting: Urology

## 2021-03-10 VITALS — BP 154/90 | HR 59 | Ht 72.0 in | Wt 288.0 lb

## 2021-03-10 DIAGNOSIS — C61 Malignant neoplasm of prostate: Secondary | ICD-10-CM

## 2021-03-10 DIAGNOSIS — R972 Elevated prostate specific antigen [PSA]: Secondary | ICD-10-CM | POA: Diagnosis not present

## 2021-03-10 MED ORDER — LEVOFLOXACIN 500 MG PO TABS
500.0000 mg | ORAL_TABLET | Freq: Once | ORAL | Status: AC
  Administered 2021-03-10: 500 mg via ORAL

## 2021-03-10 MED ORDER — GENTAMICIN SULFATE 40 MG/ML IJ SOLN
80.0000 mg | Freq: Once | INTRAMUSCULAR | Status: AC
  Administered 2021-03-10: 80 mg via INTRAMUSCULAR

## 2021-03-12 LAB — SURGICAL PATHOLOGY

## 2021-03-16 NOTE — Progress Notes (Signed)
03/17/21 4:04 PM   Alejandro Winters 23-Apr-1960 [id number]  Referring provider:  Juline Patch, MD [address] [place],  [place] [address] Chief Complaint  Patient presents with   Results     HPI: Alejandro Winters is a 61 y.o.male with a personal history of BPH with obstructive urinary symptoms, CKD, elevated PSA, and erectile dysfunction, who returns today for prostate biopsy results.   His most recent PSA was 5.74 on 02/13/2021.  Rectal exam was unremarkable.  He underwent a prostate biopsy on 03/11/2021. Surgical pathology revealed Gleason 3+3 involving 3 cores affecting up to 80%, Gleason 3+4 involving 6 cores affecting up to 100%, Gleason 4+3 involving 1 core affecting up to 50%. TRUS volume was 48gm.    Baseline IPSS is 12/3.  Severe baseline erectile dysfunction, Shim 5.  BMI is 39.  Since our last visit, he has been actively trying to lose weight and has been somewhat successful in this.   Surgical pathology:    Diagnostic Summary   [A] PROSTATE, LEFT BASE:   NEGATIVE FOR MALIGNANCY.   [B] PROSTATE, LEFT MID:   NEGATIVE FOR MALIGNANCY.   [C] PROSTATE, LEFT APEX:   ACINAR ADENOCARCINOMA, GLEASON 3+3=6  (GG  1), INVOLVING 1 CORES, MEASURING 2  MM ( 22%).   [D] PROSTATE, RIGHT BASE:   ACINAR ADENOCARCINOMA, GLEASON 3+3=6  (GG  1), INVOLVING 1 CORES, MEASURING 8  MM ( 80%).   [E] PROSTATE, RIGHT MID:   ACINAR ADENOCARCINOMA, GLEASON 3+4=7  (GG  2), INVOLVING 1 CORES, MEASURING 8  MM ( 80%). 10% PATTERN 4.   [F] PROSTATE, RIGHT APEX:   ACINAR ADENOCARCINOMA, GLEASON 3+4=7  (GG  2), INVOLVING 1 CORES, MEASURING 5  MM ( 25%). 10% PATTERN 4.   [G] PROSTATE, LEFT LATERAL BASE:   ACINAR ADENOCARCINOMA, GLEASON 4+3=7  (GG 3), INVOLVING 1 CORES, MEASURING 5  MM ( 50%). 60% PATTERN 4.   [H] PROSTATE, LEFT LATERAL MID:   ACINAR ADENOCARCINOMA, GLEASON 3+4=7  (GG 2), INVOLVING 1 CORES, MEASURING 3  MM ( 25%). 20% PATTERN 4.   [I] PROSTATE, LEFT LATERAL APEX:   ACINAR  ADENOCARCINOMA, GLEASON 3+3=6  (GG 1), INVOLVING 1 CORES, MEASURING 8  MM ( 66%).   [J] PROSTATE, RIGHT LATERAL BASE:   ACINAR ADENOCARCINOMA, GLEASON  3+4=7  (GG 2), INVOLVING 1 CORES, MEASURING 15  MM ( 100%).   [K] PROSTATE, RIGHT LATERAL MID:   ACINAR ADENOCARCINOMA, GLEASON 3+4=7  (GG 2), INVOLVING 1 CORES, MEASURING 10  MM ( 71%). 10% PATTERN 4.   [L] PROSTATE, RIGHT LATERAL APEX:   ACINAR ADENOCARCINOMA, GLEASON  3+4=7  (GG 2), INVOLVING 1 CORES, MEASURING 6  MM ( 86%). 10% PATTERN 4.      PMH: Past Medical History:  Diagnosis Date   CHF (congestive heart failure) ([place])    Depression    Hyperlipemia    Hypertension    Kidney failure    Sleep apnea    Vitiligo     Surgical History: Past Surgical History:  Procedure Laterality Date   COLONOSCOPY WITH PROPOFOL N/A 12/16/2014   Procedure: COLONOSCOPY WITH PROPOFOL;  Surgeon: Josefine Class, MD;  Location: [facility] ENDOSCOPY;  Service: Endoscopy;  Laterality: N/A;    Home Medications:  Allergies as of 03/17/2021   No Known Allergies      Medication List        Accurate as of March 17, 2021  4:04 PM. If you have any questions, ask your nurse  or doctor.          Apple Cider Vinegar 300 MG Tabs Take by mouth.   hydrochlorothiazide 25 MG tablet Commonly known as: HYDRODIURIL Take 1 tablet (25 mg total) by mouth daily.   lisinopril 20 MG tablet Commonly known as: ZESTRIL One a day   ONE-A-DAY MENS PO Take 1 tablet by mouth daily.   pravastatin 40 MG tablet Commonly known as: PRAVACHOL Take 1 tablet (40 mg total) by mouth daily.   sertraline 50 MG tablet Commonly known as: ZOLOFT TAKE 1 TABLET BY MOUTH DAILY   sildenafil 20 MG tablet Commonly known as: REVATIO Take 2-5 tablets daily as needed   tamsulosin 0.4 MG Caps capsule Commonly known as: FLOMAX Take 1 capsule (0.4 mg total) by mouth daily. Dr Erlene Quan prescribes        Allergies: No Known Allergies  Family History: Family History   Problem Relation Age of Onset   Hypertension Mother    Cancer Father    Hypertension Father    Prostate cancer Neg Hx    Bladder Cancer Neg Hx    Kidney cancer Neg Hx     Social History:  reports that he quit smoking about 22 years ago. His smoking use included cigarettes. He has a 0.50 pack-year smoking history. He has never used smokeless tobacco. He reports current alcohol use. He reports current drug use. Drug: Marijuana.   Physical Exam: BP (!) 158/89    Pulse 72    Ht 6' (1.829 m)    Wt 288 lb (130.6 kg)    BMI 39.06 kg/m   Constitutional:  Alert and oriented, No acute distress. HEENT: [place] AT, moist mucus membranes.  Trachea midline, no masses. Cardiovascular: No clubbing, cyanosis, or edema. Respiratory: Normal respiratory effort, no increased work of breathing. Skin: No rashes, bruises or suspicious lesions. Neurologic: Grossly intact, no focal deficits, moving all 4 extremities. Psychiatric: Normal mood and affect.  Laboratory Data:  Lab Results  Component Value Date   CREATININE 1.51 (H) 01/14/2021   Lab Results  Component Value Date   PSA 4.8 08/15/2020   Lab Results  Component Value Date   TESTOSTERONE 219 (L) 01/14/2021   Lab Results  Component Value Date   HGBA1C 4.7 08/15/2020    Assessment & Plan:   Unfavorable intermediate risk prostate cancer  - Complete staging with CT abdomen and pelvis   - The patient was counseled about the natural history of prostate cancer and the standard treatment options that are available for prostate cancer. It was explained to him how his age and life expectancy, clinical stage, Gleason score, and PSA affect his prognosis, the decision to proceed with additional staging studies, as well as how that information influences recommended treatment strategies. We discussed the roles for active surveillance, radiation therapy, surgical therapy, androgen deprivation, as well as ablative therapy options for the treatment of prostate  cancer as appropriate to his individual cancer situation. We discussed the risks and benefits of these options with regard to their impact on cancer control and also in terms of potential adverse events, complications, and impact on quality of life particularly related to urinary, bowel, and sexual function. The patient was encouraged to ask questions throughout the discussion today and all questions were answered to his stated satisfaction. In addition, the patient was provided with and/or directed to appropriate resources and literature for further education about prostate cancer treatment options.  -We discussed surgical therapy for prostate cancer including the different available  surgical approaches.  Specifically, we discussed robotic prostatectomy with pelvic lymph node dissection based on his restratification.  We discussed, in detail, the risks and expectations of surgery with regard to cancer control, urinary control, and erectile dysfunction as well as expected post operative recovery processed. Additional risks of surgery including but not limited to bleeding, infection, hernia formation, nerve damage, fistula formation, bowel/rectal injury, potentially necessitating colostomy, damage to the urinary tract resulting in urinary leakage, urethral stricture, and cardiopulmonary risk such as myocardial infarction, stroke, death, thromboembolism etc. were explained.   He understands based on the [facility] nomogram, is a much higher risk for having certain pathology including seminal vesicle, lymph nodes, bladder neck involvement as well as high risk for biochemical persistence and/or recurrence.  He understands if this were to happen, he would need salvage radiation.    -Alternatives including including radiotherapy were also discussed.  We also discussed a 45-month course of ADT along with this.  Discussed possible side effects of ADT.  I like him to meet with Dr. Baruch Gouty at minimum for multidisciplinary  perspective about his treatment options.  -He is leaning toward surgery.  He does like for surgery, ideally, he could lose another 15 pounds to optimize his surgical outcomes incontinence.  We would send him to physical therapy preoperatively for assessment and treatment.  He would need cardiac clearance.  He understands that we would not perform a nonnerve sparing procedure based on his volume of disease and his severe erectile dysfunction at baseline.  - Radiation oncology referral sent   Return in 3 weeks up CT scan and final decision making about treatment options  I,Kailey Littlejohn,acting as a scribe for Hollice Espy, MD.,have documented all relevant documentation on the behalf of Hollice Espy, MD,as directed by  Hollice Espy, MD while in the presence of Hollice Espy, MD.  I have reviewed the above documentation for accuracy and completeness, and I agree with the above.   Hollice Espy, MD   [facility] [address] [place], [place] [address] [phone]  I spent 47 total minutes on the day of the encounter including pre-visit review of the medical record, face-to-face time with the patient, and post visit ordering of labs/imaging/tests.

## 2021-03-17 ENCOUNTER — Other Ambulatory Visit: Payer: Self-pay

## 2021-03-17 ENCOUNTER — Ambulatory Visit (INDEPENDENT_AMBULATORY_CARE_PROVIDER_SITE_OTHER): Payer: 59 | Admitting: Urology

## 2021-03-17 ENCOUNTER — Encounter: Payer: Self-pay | Admitting: Urology

## 2021-03-17 VITALS — BP 158/89 | HR 72 | Ht 72.0 in | Wt 288.0 lb

## 2021-03-17 DIAGNOSIS — C61 Malignant neoplasm of prostate: Secondary | ICD-10-CM

## 2021-03-17 DIAGNOSIS — R972 Elevated prostate specific antigen [PSA]: Secondary | ICD-10-CM

## 2021-03-19 ENCOUNTER — Encounter: Payer: Self-pay | Admitting: *Deleted

## 2021-03-27 ENCOUNTER — Ambulatory Visit
Admission: RE | Admit: 2021-03-27 | Discharge: 2021-03-27 | Disposition: A | Discharge status: Home / Self Care | Payer: 59 | Source: Ambulatory Visit | Attending: Radiation Oncology | Admitting: Radiation Oncology

## 2021-03-27 ENCOUNTER — Other Ambulatory Visit: Payer: Self-pay

## 2021-03-27 VITALS — BP 136/90 | HR 65 | Temp 96.6°F | Resp 16 | Wt 278.5 lb

## 2021-03-27 DIAGNOSIS — I509 Heart failure, unspecified: Secondary | ICD-10-CM | POA: Insufficient documentation

## 2021-03-27 DIAGNOSIS — N529 Male erectile dysfunction, unspecified: Secondary | ICD-10-CM | POA: Diagnosis not present

## 2021-03-27 DIAGNOSIS — E785 Hyperlipidemia, unspecified: Secondary | ICD-10-CM | POA: Insufficient documentation

## 2021-03-27 DIAGNOSIS — M545 Low back pain, unspecified: Secondary | ICD-10-CM | POA: Diagnosis not present

## 2021-03-27 DIAGNOSIS — E669 Obesity, unspecified: Secondary | ICD-10-CM | POA: Insufficient documentation

## 2021-03-27 DIAGNOSIS — G4730 Sleep apnea, unspecified: Secondary | ICD-10-CM | POA: Diagnosis not present

## 2021-03-27 DIAGNOSIS — I110 Hypertensive heart disease with heart failure: Secondary | ICD-10-CM | POA: Insufficient documentation

## 2021-03-27 DIAGNOSIS — C61 Malignant neoplasm of prostate: Secondary | ICD-10-CM | POA: Diagnosis present

## 2021-03-27 DIAGNOSIS — Z87891 Personal history of nicotine dependence: Secondary | ICD-10-CM | POA: Insufficient documentation

## 2021-03-27 DIAGNOSIS — Z79899 Other long term (current) drug therapy: Secondary | ICD-10-CM | POA: Diagnosis not present

## 2021-03-27 NOTE — Consult Note (Signed)
NEW PATIENT EVALUATION  Name: Alejandro Winters  MRN: [id number]  Date:   03/27/2021     DOB: 1960-10-29   This 60 y.o. 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  REFERRING PHYSICIAN: Juline Patch, MD  CHIEF COMPLAINT:  Chief Complaint  Patient presents with   Prostate Cancer    Initial consultation    DIAGNOSIS: The encounter diagnosis was Prostate cancer [facility]).   PREVIOUS INVESTIGATIONS:  Pathology report reviewed Clinical notes reviewed CT scan of abdomen pelvis scheduled for February  HPI: Patient is a 61 year old male who presented with a PSA of 5.7.  Rectal exam was unremarkable.  He underwent transrectal ultrasound-guided biopsy showing 10 of 12 cores positive for mostly Gleason 7 (3+4) otherwise Gleason 63+3 as well as 1 core of Gleason 7 (4+3).  Patient does have erectile dysfunction and some obstructive urinary symptoms.  He has a CT scan of abdomen pelvis scheduled for early February.  He is slightly obese.  He has been seen by urology with recommendation for robotic assisted prostatectomy.  He is seen today for radiation oncology opinion.  He is having some lower back pain although I have assured him this is not metastatic cancer most likely degenerative disc disease.  PLANNED TREATMENT REGIMEN: Robotic prostatectomy  PAST MEDICAL HISTORY:  has a past medical history of CHF (congestive heart failure) ([place]), Depression, Hyperlipemia, Hypertension, Kidney failure, Sleep apnea, and Vitiligo.    PAST SURGICAL HISTORY:  Past Surgical History:  Procedure Laterality Date   COLONOSCOPY WITH PROPOFOL N/A 12/16/2014   Procedure: COLONOSCOPY WITH PROPOFOL;  Surgeon: Josefine Class, MD;  Location: [facility] ENDOSCOPY;  Service: Endoscopy;  Laterality: N/A;   PROSTATE BIOPSY      FAMILY HISTORY: family history includes Cancer in his father; Hypertension in his  father and mother.  SOCIAL HISTORY:  reports that he quit smoking about 22 years ago. His smoking use included cigarettes. He has a 0.50 pack-year smoking history. He has never used smokeless tobacco. He reports current alcohol use. He reports current drug use. Drug: Marijuana.  ALLERGIES: Patient has no known allergies.  MEDICATIONS:  Current Outpatient Medications  Medication Sig Dispense Refill   Apple Cider Vinegar 300 MG TABS Take by mouth.     hydrochlorothiazide (HYDRODIURIL) 25 MG tablet Take 1 tablet (25 mg total) by mouth daily. 90 tablet 1   lisinopril (ZESTRIL) 20 MG tablet One a day 90 tablet 1   Multiple Vitamin (ONE-A-DAY MENS PO) Take 1 tablet by mouth daily.     pravastatin (PRAVACHOL) 40 MG tablet Take 1 tablet (40 mg total) by mouth daily. 90 tablet 1   sertraline (ZOLOFT) 50 MG tablet TAKE 1 TABLET BY MOUTH DAILY 90 tablet 1   sildenafil (REVATIO) 20 MG tablet Take 2-5 tablets daily as needed 30 tablet 1   tamsulosin (FLOMAX) 0.4 MG CAPS capsule Take 1 capsule (0.4 mg total) by mouth daily. Dr Erlene Quan prescribes 90 capsule 2   No current facility-administered medications for this encounter.    ECOG PERFORMANCE STATUS:  1 - Symptomatic but completely ambulatory  REVIEW OF SYSTEMS: Patient denies any weight loss, fatigue, weakness, fever, chills or night sweats. Patient denies any loss of vision, blurred vision. Patient denies any ringing  of the ears or hearing loss. No irregular heartbeat. Patient denies heart murmur or history of fainting. Patient denies any chest pain or pain  radiating to her upper extremities. Patient denies any shortness of breath, difficulty breathing at night, cough or hemoptysis. Patient denies any swelling in the lower legs. Patient denies any nausea vomiting, vomiting of blood, or coffee ground material in the vomitus. Patient denies any stomach pain. Patient states has had normal bowel movements no significant constipation or diarrhea. Patient  denies any dysuria, hematuria or significant nocturia. Patient denies any problems walking, swelling in the joints or loss of balance. Patient denies any skin changes, loss of hair or loss of weight. Patient denies any excessive worrying or anxiety or significant depression. Patient denies any problems with insomnia. Patient denies excessive thirst, polyuria, polydipsia. Patient denies any swollen glands, patient denies easy bruising or easy bleeding. Patient denies any recent infections, allergies or URI. Patient "s visual fields have not changed significantly in recent time.   PHYSICAL EXAM: BP 136/90 (BP Location: Left Arm)    Pulse 65    Temp (!) 96.6 F (35.9 C) (Tympanic)    Resp 16    Wt 278 lb 8 oz (126.3 kg)    BMI 37.77 kg/m  Mildly obese male in NAD.  Well-developed well-nourished patient in NAD. HEENT reveals PERLA, EOMI, discs not visualized.  Oral cavity is clear. No oral mucosal lesions are identified. Neck is clear without evidence of cervical or supraclavicular adenopathy. Lungs are clear to A&P. Cardiac examination is essentially unremarkable with regular rate and rhythm without murmur rub or thrill. Abdomen is benign with no organomegaly or masses noted. Motor sensory and DTR levels are equal and symmetric in the upper and lower extremities. Cranial nerves II through XII are grossly intact. Proprioception is intact. No peripheral adenopathy or edema is identified. No motor or sensory levels are noted. Crude visual fields are within normal range.  LABORATORY DATA: Pathology report reviewed    RADIOLOGY RESULTS: CT scan will be reviewed when available   IMPRESSION: Stage IIb Gleason 7 (3+4) adenocarcinoma the prostate in 61 year old male  PLAN: I have run the [facility] nomogram showing low likelihood of lymph node involvement with about a 50% chance of extracapsular extension.  We have had a discussion about treatment options and based on the fact that I cannot salvage  the patient wants to give him radiation I have recommended as planned #1 robotic prostatectomy.  He is young and healthy and after l discussion he is agreeable to surgical procedure.  We also discussed salvage radiation therapy should he have biochemical recurrence in the future which is not possible once we do radiation therapy.  He comprehends our treatment recommendations well.  He has a follow-up appointment already with Dr. Erlene Quan.  I would like to take this opportunity to thank you for allowing me to participate in the care of your patient.Noreene Filbert, MD

## 2021-04-06 ENCOUNTER — Other Ambulatory Visit: Payer: Self-pay

## 2021-04-06 ENCOUNTER — Ambulatory Visit
Admission: RE | Admit: 2021-04-06 | Discharge: 2021-04-06 | Disposition: A | Discharge status: Home / Self Care | Payer: 59 | Source: Ambulatory Visit | Attending: Urology | Admitting: Urology

## 2021-04-06 DIAGNOSIS — C61 Malignant neoplasm of prostate: Secondary | ICD-10-CM | POA: Diagnosis present

## 2021-04-06 DIAGNOSIS — I700 Atherosclerosis of aorta: Secondary | ICD-10-CM | POA: Diagnosis not present

## 2021-04-06 DIAGNOSIS — R972 Elevated prostate specific antigen [PSA]: Secondary | ICD-10-CM | POA: Insufficient documentation

## 2021-04-06 HISTORY — DX: Malignant (primary) neoplasm, unspecified: C80.1

## 2021-04-06 LAB — POCT I-STAT CREATININE: Creatinine, Ser: 1.6 mg/dL — ABNORMAL HIGH (ref 0.61–1.24)

## 2021-04-06 MED ORDER — IOHEXOL 300 MG/ML  SOLN
100.0000 mL | Freq: Once | INTRAMUSCULAR | Status: AC | PRN
  Administered 2021-04-06: 100 mL via INTRAVENOUS

## 2021-04-06 NOTE — H&P (View-Only) (Signed)
04/07/21 4:39 PM   Alejandro Winters Oct 03, 1960 [id number]  Referring provider:  Juline Patch, MD [address] [place],  [place] [address] No chief complaint on file.    HPI: Alejandro Winters is a 61 y.o.male a personal history of BPH with obstructive urinary symptoms, CKD, elevated PSA, and erectile dysfunction, who returns today for CT follow-up.   His most recent PSA was 5.74 on 02/13/2021.  Rectal exam was unremarkable.   He underwent a prostate biopsy on 03/11/2021. Surgical pathology revealed Gleason 3+3 involving 3 cores affecting up to 80%, Gleason 3+4 involving 6 cores affecting up to 100%, Gleason 4+3 involving 1 core affecting up to 50%. TRUS volume was 48gm.     Baseline IPSS is 12/3.  Severe baseline erectile dysfunction, Shim 5  He was seen by Dr.Chrystal and he is still most interested in surgery.   CT scan on 04/06/2021 visualized no CT evidence of osseous metastatic disease.   He reports today that he has trouble with his erections. 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   PMH: Past Medical History:  Diagnosis Date   Cancer ([place])    CHF (congestive heart failure) ([place])    Depression    Hyperlipemia    Hypertension    Kidney failure    Sleep apnea    Vitiligo     Surgical History: Past Surgical History:  Procedure Laterality Date   COLONOSCOPY WITH PROPOFOL N/A 12/16/2014   Procedure: COLONOSCOPY WITH PROPOFOL;  Surgeon: Alejandro Class, MD;  Location: [facility] ENDOSCOPY;  Service: Endoscopy;  Laterality: N/A;   PROSTATE BIOPSY      Home Medications:  Allergies as of 04/07/2021   No Known Allergies      Medication List        Accurate as of April 07, 2021  4:39 PM. If you have any questions, ask your nurse or doctor.          Apple Cider Vinegar 300 MG Tabs Take by mouth.   hydrochlorothiazide 25 MG tablet Commonly known as: HYDRODIURIL Take 1 tablet (25 mg total) by mouth daily.   lisinopril 20 MG tablet Commonly known as:  ZESTRIL One a day   ONE-A-DAY MENS PO Take 1 tablet by mouth daily.   pravastatin 40 MG tablet Commonly known as: PRAVACHOL Take 1 tablet (40 mg total) by mouth daily.   sertraline 50 MG tablet Commonly known as: ZOLOFT TAKE 1 TABLET BY MOUTH DAILY   sildenafil 20 MG tablet Commonly known as: REVATIO Take 2-5 tablets daily as needed   tamsulosin 0.4 MG Caps capsule Commonly known as: FLOMAX Take 1 capsule (0.4 mg total) by mouth daily. Dr Alejandro Winters prescribes        Allergies: No Known Allergies  Family History: Family History  Problem Relation Age of Onset   Hypertension Mother    Cancer Father    Hypertension Father    Prostate cancer Neg Hx    Bladder Cancer Neg Hx    Kidney cancer Neg Hx     Social History:  reports that he quit smoking about 22 years ago. His smoking use included cigarettes. He has a 0.50 pack-year smoking history. He has never used smokeless tobacco. He reports current alcohol use. He reports current drug use. Drug: Marijuana.   Physical Exam: BP 137/81    Pulse 82    Ht 6' (1.829 m)    Wt 275 lb (124.7 kg)    BMI 37.30 kg/m  Constitutional:  Alert and oriented, No acute distress. HEENT: [place] AT, moist mucus membranes.  Trachea midline, no masses. Cardiovascular: No clubbing, cyanosis, or edema. Respiratory: Normal respiratory effort, no increased work of breathing. GI: Abdomen is soft, nontender, nondistended, very small probable fat-containing umbilical hernia Skin: No rashes, bruises or suspicious lesions. Neurologic: Grossly intact, no focal deficits, moving all 4 extremities. Psychiatric: Normal mood and affect.   Pertinent Imaging: CLINICAL DATA:  New diagnosis prostate cancer staging   EXAM: CT ABDOMEN AND PELVIS WITH CONTRAST   TECHNIQUE: Multidetector CT imaging of the abdomen and pelvis was performed using the standard protocol following bolus administration of intravenous contrast.   RADIATION DOSE REDUCTION: This exam  was performed according to the departmental dose-optimization program which includes automated exposure control, adjustment of the mA and/or kV according to patient size and/or use of iterative reconstruction technique.   CONTRAST:  166mL OMNIPAQUE IOHEXOL 300 MG/ML SOLN, additional oral enteric contrast   COMPARISON:  None.   FINDINGS: Lower chest: No acute abnormality.   Hepatobiliary: No solid liver abnormality is seen. Multiple fluid signal simple cysts or hemangiomata throughout the liver. No gallstones, gallbladder wall thickening, or biliary dilatation.   Pancreas: Unremarkable. No pancreatic ductal dilatation or surrounding inflammatory changes.   Spleen: Normal in size without significant abnormality.   Adrenals/Urinary Tract: Adrenal glands are unremarkable. Simple, benign cyst of the posterior midportion of the left kidney (series 6, image 43). Kidneys are otherwise normal, without renal calculi, solid lesion, or hydronephrosis. Bladder is unremarkable.   Stomach/Bowel: Stomach is within normal limits. Appendix appears normal. No evidence of bowel wall thickening, distention, or inflammatory changes. Pancolonic diverticulosis.   Vascular/Lymphatic: Aortic atherosclerosis. No enlarged abdominal or pelvic lymph nodes.   Reproductive: Mild prostatomegaly.   Other: No abdominal wall hernia or abnormality. No ascites.   Musculoskeletal: No acute or significant osseous findings.   IMPRESSION: 1. Mild prostatomegaly. 2. No evidence of mass or lymphadenopathy in the abdomen or pelvis. 3. No CT evidence of osseous metastatic disease. Nuclear scintigraphic whole body bone scan or Pylarify PET-CT are more sensitive for the detection of osseous metastatic disease in setting of prostate cancer 4. Pancolonic diverticulosis without evidence of acute diverticulitis.   Aortic Atherosclerosis (ICD10-I70.0).     Electronically Signed   By: Alejandro Winters M.D.   On:  04/07/2021 12:06  I have personally reviewed the images and agree with radiologist interpretation.    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  We discussed, in detail, the risks and expectations of surgery with regard to cancer control, urinary control, and erectile dysfunction as well as expected post operative recovery processed. Additional risks of surgery including but not limited to bleeding, infection, hernia formation, nerve damage, fistula formation, bowel/rectal injury, potentially necessitating colostomy, damage to the urinary tract resulting in urinary leakage, urethral stricture, and cardiopulmonary risk such as myocardial infarction, stroke, death, thromboembolism etc. were explained.  -Given his very poor erectile status, will likely proceed with non-nerve sparing - Referral to physical therapy was sent today  I,Kailey Littlejohn,acting as a scribe for Hollice Espy, MD.,have documented all relevant documentation on the behalf of Hollice Espy, MD,as directed by  Hollice Espy, MD while  in the presence of Hollice Espy, MD.  I have reviewed the above documentation for accuracy and completeness, and I agree with the above.   Hollice Espy, MD   [facility] [address], [place], [place] [address] [phone]

## 2021-04-06 NOTE — Progress Notes (Signed)
04/07/21 4:39 PM   Alejandro Winters 05-May-1960 [id number]  Referring provider:  Juline Patch, MD [address] [place],  [place] [address] No chief complaint on file.    HPI: Alejandro Winters is a 61 y.o.male a personal history of BPH with obstructive urinary symptoms, CKD, elevated PSA, and erectile dysfunction, who returns today for CT follow-up.   His most recent PSA was 5.74 on 02/13/2021.  Rectal exam was unremarkable.   He underwent a prostate biopsy on 03/11/2021. Surgical pathology revealed Gleason 3+3 involving 3 cores affecting up to 80%, Gleason 3+4 involving 6 cores affecting up to 100%, Gleason 4+3 involving 1 core affecting up to 50%. TRUS volume was 48gm.     Baseline IPSS is 12/3.  Severe baseline erectile dysfunction, Shim 5  He was seen by Dr.Chrystal and he is still most interested in surgery.   CT scan on 04/06/2021 visualized no CT evidence of osseous metastatic disease.   He reports today that he has trouble with his erections. 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   PMH: Past Medical History:  Diagnosis Date   Cancer ([place])    CHF (congestive heart failure) ([place])    Depression    Hyperlipemia    Hypertension    Kidney failure    Sleep apnea    Vitiligo     Surgical History: Past Surgical History:  Procedure Laterality Date   COLONOSCOPY WITH PROPOFOL N/A 12/16/2014   Procedure: COLONOSCOPY WITH PROPOFOL;  Surgeon: Josefine Class, MD;  Location: [facility] ENDOSCOPY;  Service: Endoscopy;  Laterality: N/A;   PROSTATE BIOPSY      Home Medications:  Allergies as of 04/07/2021   No Known Allergies      Medication List        Accurate as of April 07, 2021  4:39 PM. If you have any questions, ask your nurse or doctor.          Apple Cider Vinegar 300 MG Tabs Take by mouth.   hydrochlorothiazide 25 MG tablet Commonly known as: HYDRODIURIL Take 1 tablet (25 mg total) by mouth daily.   lisinopril 20 MG tablet Commonly known as:  ZESTRIL One a day   ONE-A-DAY MENS PO Take 1 tablet by mouth daily.   pravastatin 40 MG tablet Commonly known as: PRAVACHOL Take 1 tablet (40 mg total) by mouth daily.   sertraline 50 MG tablet Commonly known as: ZOLOFT TAKE 1 TABLET BY MOUTH DAILY   sildenafil 20 MG tablet Commonly known as: REVATIO Take 2-5 tablets daily as needed   tamsulosin 0.4 MG Caps capsule Commonly known as: FLOMAX Take 1 capsule (0.4 mg total) by mouth daily. Dr Erlene Quan prescribes        Allergies: No Known Allergies  Family History: Family History  Problem Relation Age of Onset   Hypertension Mother    Cancer Father    Hypertension Father    Prostate cancer Neg Hx    Bladder Cancer Neg Hx    Kidney cancer Neg Hx     Social History:  reports that he quit smoking about 22 years ago. His smoking use included cigarettes. He has a 0.50 pack-year smoking history. He has never used smokeless tobacco. He reports current alcohol use. He reports current drug use. Drug: Marijuana.   Physical Exam: BP 137/81    Pulse 82    Ht 6' (1.829 m)    Wt 275 lb (124.7 kg)    BMI 37.30 kg/m  Constitutional:  Alert and oriented, No acute distress. HEENT: [place] AT, moist mucus membranes.  Trachea midline, no masses. Cardiovascular: No clubbing, cyanosis, or edema. Respiratory: Normal respiratory effort, no increased work of breathing. GI: Abdomen is soft, nontender, nondistended, very small probable fat-containing umbilical hernia Skin: No rashes, bruises or suspicious lesions. Neurologic: Grossly intact, no focal deficits, moving all 4 extremities. Psychiatric: Normal mood and affect.   Pertinent Imaging: CLINICAL DATA:  New diagnosis prostate cancer staging   EXAM: CT ABDOMEN AND PELVIS WITH CONTRAST   TECHNIQUE: Multidetector CT imaging of the abdomen and pelvis was performed using the standard protocol following bolus administration of intravenous contrast.   RADIATION DOSE REDUCTION: This exam  was performed according to the departmental dose-optimization program which includes automated exposure control, adjustment of the mA and/or kV according to patient size and/or use of iterative reconstruction technique.   CONTRAST:  161mL OMNIPAQUE IOHEXOL 300 MG/ML SOLN, additional oral enteric contrast   COMPARISON:  None.   FINDINGS: Lower chest: No acute abnormality.   Hepatobiliary: No solid liver abnormality is seen. Multiple fluid signal simple cysts or hemangiomata throughout the liver. No gallstones, gallbladder wall thickening, or biliary dilatation.   Pancreas: Unremarkable. No pancreatic ductal dilatation or surrounding inflammatory changes.   Spleen: Normal in size without significant abnormality.   Adrenals/Urinary Tract: Adrenal glands are unremarkable. Simple, benign cyst of the posterior midportion of the left kidney (series 6, image 43). Kidneys are otherwise normal, without renal calculi, solid lesion, or hydronephrosis. Bladder is unremarkable.   Stomach/Bowel: Stomach is within normal limits. Appendix appears normal. No evidence of bowel wall thickening, distention, or inflammatory changes. Pancolonic diverticulosis.   Vascular/Lymphatic: Aortic atherosclerosis. No enlarged abdominal or pelvic lymph nodes.   Reproductive: Mild prostatomegaly.   Other: No abdominal wall hernia or abnormality. No ascites.   Musculoskeletal: No acute or significant osseous findings.   IMPRESSION: 1. Mild prostatomegaly. 2. No evidence of mass or lymphadenopathy in the abdomen or pelvis. 3. No CT evidence of osseous metastatic disease. Nuclear scintigraphic whole body bone scan or Pylarify PET-CT are more sensitive for the detection of osseous metastatic disease in setting of prostate cancer 4. Pancolonic diverticulosis without evidence of acute diverticulitis.   Aortic Atherosclerosis (ICD10-I70.0).     Electronically Signed   By: Delanna Ahmadi M.D.   On:  04/07/2021 12:06  I have personally reviewed the images and agree with radiologist interpretation.    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  We discussed, in detail, the risks and expectations of surgery with regard to cancer control, urinary control, and erectile dysfunction as well as expected post operative recovery processed. Additional risks of surgery including but not limited to bleeding, infection, hernia formation, nerve damage, fistula formation, bowel/rectal injury, potentially necessitating colostomy, damage to the urinary tract resulting in urinary leakage, urethral stricture, and cardiopulmonary risk such as myocardial infarction, stroke, death, thromboembolism etc. were explained.  -Given his very poor erectile status, will likely proceed with non-nerve sparing - Referral to physical therapy was sent today  I,Kailey Littlejohn,acting as a scribe for Hollice Espy, MD.,have documented all relevant documentation on the behalf of Hollice Espy, MD,as directed by  Hollice Espy, MD while  in the presence of Hollice Espy, MD.  I have reviewed the above documentation for accuracy and completeness, and I agree with the above.   Hollice Espy, MD   [facility] [address] [place], [place] [address] [phone]

## 2021-04-07 ENCOUNTER — Other Ambulatory Visit: Payer: Self-pay

## 2021-04-07 ENCOUNTER — Encounter: Payer: Self-pay | Admitting: Urology

## 2021-04-07 ENCOUNTER — Ambulatory Visit (INDEPENDENT_AMBULATORY_CARE_PROVIDER_SITE_OTHER): Payer: 59 | Admitting: Urology

## 2021-04-07 VITALS — BP 137/81 | HR 82 | Ht 72.0 in | Wt 275.0 lb

## 2021-04-07 DIAGNOSIS — R972 Elevated prostate specific antigen [PSA]: Secondary | ICD-10-CM

## 2021-04-07 DIAGNOSIS — C61 Malignant neoplasm of prostate: Secondary | ICD-10-CM

## 2021-04-10 ENCOUNTER — Telehealth: Payer: Self-pay | Admitting: Urology

## 2021-04-10 ENCOUNTER — Other Ambulatory Visit: Payer: Self-pay

## 2021-04-10 DIAGNOSIS — C61 Malignant neoplasm of prostate: Secondary | ICD-10-CM

## 2021-04-10 NOTE — Telephone Encounter (Signed)
I spoke with Alejandro Winters. We have discussed possible surgery dates and Monday February 27th, 2023 was agreed upon by all parties. Patient given information about surgery date, what to expect pre-operatively and post operatively.   We discussed that a Pre-Admission Testing office will be calling to set up the pre-op visit that will take place prior to surgery, and that these appointments are typically done over the phone with a Pre-Admissions RN.   Informed patient that our office will communicate any additional care to be provided after surgery. Patients questions or concerns were discussed during our call. Advised to call our office should there be any additional information, questions or concerns that arise. Patient verbalized understanding.

## 2021-04-10 NOTE — Telephone Encounter (Signed)
Surgical Physician Order Form [facility]  * Scheduling expectation : Next Available  *Length of Case:   *Clearance needed: no  *Anticoagulation Instructions: Hold all anticoagulants  *Aspirin Instructions: Hold Aspirin  *Post-op visit Date/Instructions:  1 week cath removal  *Diagnosis: Prostate Cancer  *Procedure: bilateral pelvic LN dissection, Robotic laparoscopic Prostatectomy ([id number])   Additional orders: N/A  -Admit type: Observation  -Anesthesia: General  -VTE Prophylaxis Standing Order SCDs       Other:   -Standing Lab Orders Per Anesthesia    Lab other:  UA/urine culture, CBC, BMP, INR, type and screen  -Standing Test orders EKG/Chest x-ray per Anesthesia       Test other:   - Medications:  Ancef 2gm IV  -Other orders:  N/A

## 2021-04-10 NOTE — Progress Notes (Signed)
[facility] Posting Form   Surgery Date/Time: Date: 04/27/2021  Surgeon: Dr. Hollice Espy, Dr. Sharee Holster  Surgery Location: Day Surgery  Inpt ( No  )   Outpt (No)   Obs ( Yes  )   Diagnosis: C61 Prostate Cancer  -CPT: 35009, [phone]   Surgery: Robotic laparoscopic Radical Prostatectomy with bilateral pelvic lymph node dissection  Stop Anticoagulations: Yes and also hold ASA  Cardiac/Medical/Pulmonary Clearance needed: no  *Orders entered into EPIC  Date: 04/10/21   *Case booked in EPIC  Date: 04/10/21  *Notified pt of Surgery: Date: 04/10/21  PRE-OP UA & CX: Yes, also, CBC,BMP, INR, Type and Screen  *Placed into Prior Authorization Work [place] Date: 04/10/21   Assistant/laser/rep:No

## 2021-04-16 ENCOUNTER — Encounter: Payer: Self-pay | Admitting: Urology

## 2021-04-20 ENCOUNTER — Encounter
Admission: RE | Admit: 2021-04-20 | Discharge: 2021-04-20 | Disposition: A | Discharge status: Home / Self Care | Payer: 59 | Source: Ambulatory Visit | Attending: Urology | Admitting: Urology

## 2021-04-20 ENCOUNTER — Other Ambulatory Visit: Payer: Self-pay

## 2021-04-20 NOTE — Patient Instructions (Signed)
Your procedure is scheduled on: 04/27/21 Report to [place]. To find out your arrival time please call [phone] between 1PM - 3PM on 04/24/21.  Remember: Instructions that are not followed completely may result in serious medical risk, up to and including death, or upon the discretion of your surgeon and anesthesiologist your surgery may need to be rescheduled.     _X__ 1. Do not eat food or drink any liquids after midnight the night before your procedure.                 No gum chewing or hard candies.   __X__2.  On the morning of surgery brush your teeth with toothpaste and water, you                 may rinse your mouth with mouthwash if you wish.  Do not swallow any              toothpaste of mouthwash.     _X__ 3.  No Alcohol for 24 hours before or after surgery.   _X__ 4.  Do Not Smoke or use e-cigarettes For 24 Hours Prior to Your Surgery.                 Do not use any chewable tobacco products for at least 6 hours prior to                 surgery.  ____  5.  Bring all medications with you on the day of surgery if instructed.   __X__  6.  Notify your doctor if there is any change in your medical condition      (cold, fever, infections).     Do not wear jewelry, make-up, hairpins, clips or nail polish. Do not wear lotions, powders, or perfumes.  Do not shave body hair 48 hours prior to surgery. Men may shave face and neck. Do not bring valuables to the hospital.    [facility] is not responsible for any belongings or valuables.  Contacts, dentures/partials or body piercings may not be worn into surgery. Bring a case for your contacts, glasses or hearing aids, a denture cup will be supplied. Leave your suitcase in the car. After surgery it may be brought to your room. For patients admitted to the hospital, discharge time is determined by your treatment team.   Patients discharged the day of surgery will not be  allowed to drive home.   Please read over the following fact sheets that you were given:   CHG soap  __X__ Take these medicines the morning of surgery with A SIP OF WATER:    1. pravastatin (PRAVACHOL) 40 MG tablet  2. sertraline (ZOLOFT) 50 MG tablet  3.   4.  5.  6.  ____ Fleet Enema (as directed)   __X__ Use CHG Soap/SAGE wipes as directed  ____ Use inhalers on the day of surgery  ____ Stop metformin/Janumet/Farxiga 2 days prior to surgery    ____ Take 1/2 of usual insulin dose the night before surgery. No insulin the morning          of surgery.   ____ Stop Blood Thinners Coumadin/Plavix/Xarelto/Pleta/Pradaxa/Eliquis/Effient/Aspirin  on   Or contact your Surgeon, Cardiologist or Medical Doctor regarding  ability to stop your blood thinners  __X__ Stop Anti-inflammatories 7 days before surgery such as Advil, Ibuprofen, Motrin,  BC or Goodies Powder, Naprosyn, Naproxen, Aleve, Aspirin   You  may take Tylenol for pain if needed  __X__ Stop all herbals and supplements, fish oil or vitamins for 7 days prior to  surgery.  Hold Tumeric and Apple Cider Vinegar  __X__ Bring C-Pap to the hospital.

## 2021-04-21 ENCOUNTER — Encounter: Payer: Self-pay | Admitting: Urgent Care

## 2021-04-21 ENCOUNTER — Ambulatory Visit: Payer: 59 | Admitting: Physical Therapy

## 2021-04-21 ENCOUNTER — Other Ambulatory Visit
Admission: RE | Admit: 2021-04-21 | Discharge: 2021-04-21 | Disposition: A | Discharge status: Home / Self Care | Payer: 59 | Source: Ambulatory Visit | Attending: Urology | Admitting: Urology

## 2021-04-21 DIAGNOSIS — C61 Malignant neoplasm of prostate: Secondary | ICD-10-CM | POA: Diagnosis not present

## 2021-04-21 DIAGNOSIS — Z01818 Encounter for other preprocedural examination: Secondary | ICD-10-CM | POA: Diagnosis not present

## 2021-04-21 LAB — URINALYSIS, COMPLETE (UACMP) WITH MICROSCOPIC
Bacteria, UA: NONE SEEN
Bilirubin Urine: NEGATIVE
Glucose, UA: NEGATIVE mg/dL
Hgb urine dipstick: NEGATIVE
Ketones, ur: NEGATIVE mg/dL
Leukocytes,Ua: NEGATIVE
Nitrite: NEGATIVE
Protein, ur: NEGATIVE mg/dL
Specific Gravity, Urine: 1.011 (ref 1.005–1.030)
WBC, UA: NONE SEEN WBC/hpf (ref 0–5)
pH: 6 (ref 5.0–8.0)

## 2021-04-21 LAB — BASIC METABOLIC PANEL
Anion gap: 9 (ref 5–15)
BUN: 19 mg/dL (ref 8–23)
CO2: 28 mmol/L (ref 22–32)
Calcium: 9.7 mg/dL (ref 8.9–10.3)
Chloride: 103 mmol/L (ref 98–111)
Creatinine, Ser: 1.42 mg/dL — ABNORMAL HIGH (ref 0.61–1.24)
GFR, Estimated: 56 mL/min — ABNORMAL LOW (ref >60)
Glucose, Bld: 115 mg/dL — ABNORMAL HIGH (ref 70–99)
Potassium: 3.5 mmol/L (ref 3.5–5.1)
Sodium: 140 mmol/L (ref 135–145)

## 2021-04-21 LAB — PROTIME-INR
INR: 1 (ref 0.8–1.2)
Prothrombin Time: 13.4 seconds (ref 11.4–15.2)

## 2021-04-21 LAB — CBC
HCT: 38.8 % — ABNORMAL LOW (ref 39.0–52.0)
Hemoglobin: 12.9 g/dL — ABNORMAL LOW (ref 13.0–17.0)
MCH: 31.7 pg (ref 26.0–34.0)
MCHC: 33.2 g/dL (ref 30.0–36.0)
MCV: 95.3 fL (ref 80.0–100.0)
Platelets: 247 10*3/uL (ref 150–400)
RBC: 4.07 MIL/uL — ABNORMAL LOW (ref 4.22–5.81)
RDW: 11.4 % — ABNORMAL LOW (ref 11.5–15.5)
WBC: 5.2 10*3/uL (ref 4.0–10.5)
nRBC: 0 % (ref 0.0–0.2)

## 2021-04-21 LAB — TYPE AND SCREEN
ABO/RH(D): B POS
Antibody Screen: NEGATIVE

## 2021-04-24 ENCOUNTER — Other Ambulatory Visit: Payer: Self-pay

## 2021-04-24 ENCOUNTER — Inpatient Hospital Stay: Admission: RE | Admit: 2021-04-24 | Payer: 59 | Source: Ambulatory Visit

## 2021-04-24 ENCOUNTER — Other Ambulatory Visit
Admission: RE | Admit: 2021-04-24 | Discharge: 2021-04-24 | Disposition: A | Discharge status: Home / Self Care | Payer: 59 | Source: Ambulatory Visit | Attending: Urology | Admitting: Urology

## 2021-04-24 DIAGNOSIS — Z20822 Contact with and (suspected) exposure to covid-19: Secondary | ICD-10-CM | POA: Diagnosis not present

## 2021-04-24 DIAGNOSIS — C61 Malignant neoplasm of prostate: Secondary | ICD-10-CM | POA: Diagnosis not present

## 2021-04-24 DIAGNOSIS — Z01812 Encounter for preprocedural laboratory examination: Secondary | ICD-10-CM | POA: Diagnosis present

## 2021-04-24 DIAGNOSIS — Z01818 Encounter for other preprocedural examination: Secondary | ICD-10-CM

## 2021-04-24 LAB — SARS CORONAVIRUS 2 (TAT 6-24 HRS): SARS Coronavirus 2: NEGATIVE

## 2021-04-26 LAB — URINE CULTURE: Culture: NO GROWTH

## 2021-04-27 ENCOUNTER — Encounter
Admission: RE | Disposition: A | Discharge status: Home / Self Care | Payer: Self-pay | Source: Home / Self Care | Attending: Urology

## 2021-04-27 ENCOUNTER — Telehealth: Payer: Self-pay | Admitting: *Deleted

## 2021-04-27 ENCOUNTER — Observation Stay
Admission: RE | Admit: 2021-04-27 | Discharge: 2021-04-28 | Disposition: A | Discharge status: Home / Self Care | Payer: 59 | Attending: Urology | Admitting: Urology

## 2021-04-27 ENCOUNTER — Encounter: Payer: Self-pay | Admitting: Urology

## 2021-04-27 ENCOUNTER — Ambulatory Visit: Payer: 59 | Admitting: Certified Registered"

## 2021-04-27 ENCOUNTER — Other Ambulatory Visit: Payer: Self-pay

## 2021-04-27 DIAGNOSIS — I130 Hypertensive heart and chronic kidney disease with heart failure and stage 1 through stage 4 chronic kidney disease, or unspecified chronic kidney disease: Secondary | ICD-10-CM | POA: Diagnosis not present

## 2021-04-27 DIAGNOSIS — C61 Malignant neoplasm of prostate: Principal | ICD-10-CM | POA: Insufficient documentation

## 2021-04-27 DIAGNOSIS — I509 Heart failure, unspecified: Secondary | ICD-10-CM | POA: Insufficient documentation

## 2021-04-27 DIAGNOSIS — Z87891 Personal history of nicotine dependence: Secondary | ICD-10-CM | POA: Insufficient documentation

## 2021-04-27 DIAGNOSIS — N183 Chronic kidney disease, stage 3 unspecified: Secondary | ICD-10-CM | POA: Insufficient documentation

## 2021-04-27 DIAGNOSIS — Z8546 Personal history of malignant neoplasm of prostate: Secondary | ICD-10-CM | POA: Diagnosis present

## 2021-04-27 HISTORY — PX: PELVIC LYMPH NODE DISSECTION: SHX6543

## 2021-04-27 HISTORY — PX: ROBOT ASSISTED LAPAROSCOPIC RADICAL PROSTATECTOMY: SHX5141

## 2021-04-27 LAB — ABO/RH: ABO/RH(D): B POS

## 2021-04-27 SURGERY — PROSTATECTOMY, RADICAL, ROBOT-ASSISTED, LAPAROSCOPIC
Anesthesia: General

## 2021-04-27 MED ORDER — STERILE WATER FOR IRRIGATION IR SOLN
Status: DC | PRN
  Administered 2021-04-27: 500 mL

## 2021-04-27 MED ORDER — HEPARIN SODIUM (PORCINE) 5000 UNIT/ML IJ SOLN
5000.0000 [IU] | Freq: Three times a day (TID) | INTRAMUSCULAR | Status: DC
  Administered 2021-04-27 – 2021-04-28 (×3): 5000 [IU] via SUBCUTANEOUS
  Filled 2021-04-27 (×3): qty 1

## 2021-04-27 MED ORDER — PROPOFOL 10 MG/ML IV BOLUS
INTRAVENOUS | Status: DC | PRN
  Administered 2021-04-27: 200 mg via INTRAVENOUS

## 2021-04-27 MED ORDER — SURGIFLO WITH THROMBIN (HEMOSTATIC MATRIX KIT) OPTIME
TOPICAL | Status: DC | PRN
  Administered 2021-04-27: 1 via TOPICAL

## 2021-04-27 MED ORDER — PRAVASTATIN SODIUM 20 MG PO TABS
40.0000 mg | ORAL_TABLET | Freq: Every day | ORAL | Status: DC
  Administered 2021-04-27 – 2021-04-28 (×2): 40 mg via ORAL
  Filled 2021-04-27 (×2): qty 2

## 2021-04-27 MED ORDER — ONDANSETRON HCL 4 MG/2ML IJ SOLN
INTRAMUSCULAR | Status: DC | PRN
Start: 2021-04-27 — End: 2021-04-27
  Administered 2021-04-27: 4 mg via INTRAVENOUS

## 2021-04-27 MED ORDER — SUGAMMADEX SODIUM 500 MG/5ML IV SOLN
INTRAVENOUS | Status: DC | PRN
  Administered 2021-04-27: 250 mg via INTRAVENOUS

## 2021-04-27 MED ORDER — MIDAZOLAM HCL 5 MG/5ML IJ SOLN
INTRAMUSCULAR | Status: DC | PRN
  Administered 2021-04-27: 2 mg via INTRAVENOUS

## 2021-04-27 MED ORDER — HYDROMORPHONE HCL 1 MG/ML IJ SOLN
INTRAMUSCULAR | Status: AC
  Filled 2021-04-27: qty 1

## 2021-04-27 MED ORDER — FENTANYL CITRATE (PF) 100 MCG/2ML IJ SOLN
25.0000 ug | INTRAMUSCULAR | Status: DC | PRN
  Administered 2021-04-27 (×2): 25 ug via INTRAVENOUS

## 2021-04-27 MED ORDER — DIPHENHYDRAMINE HCL 50 MG/ML IJ SOLN: 12.5000 mg | Freq: Four times a day (QID) | INTRAMUSCULAR | Status: DC | PRN

## 2021-04-27 MED ORDER — SODIUM CHLORIDE 0.9 % IV SOLN: INTRAVENOUS | Status: DC

## 2021-04-27 MED ORDER — ONDANSETRON HCL 4 MG/2ML IJ SOLN: 4.0000 mg | Freq: Once | INTRAMUSCULAR | Status: DC | PRN

## 2021-04-27 MED ORDER — HYDROMORPHONE HCL 1 MG/ML IJ SOLN
INTRAMUSCULAR | Status: DC | PRN
Start: 2021-04-27 — End: 2021-04-27
  Administered 2021-04-27: 1 mg via INTRAVENOUS

## 2021-04-27 MED ORDER — MORPHINE SULFATE (PF) 2 MG/ML IV SOLN
2.0000 mg | INTRAVENOUS | Status: DC | PRN
  Administered 2021-04-27: 2 mg via INTRAVENOUS
  Filled 2021-04-27: qty 2

## 2021-04-27 MED ORDER — OXYCODONE-ACETAMINOPHEN 5-325 MG PO TABS
1.0000 | ORAL_TABLET | ORAL | Status: DC | PRN
  Administered 2021-04-28: 1 via ORAL
  Administered 2021-04-28: 2 via ORAL
  Filled 2021-04-27: qty 1
  Filled 2021-04-27: qty 2

## 2021-04-27 MED ORDER — ONDANSETRON HCL 4 MG/2ML IJ SOLN
INTRAMUSCULAR | Status: AC
  Filled 2021-04-27: qty 2

## 2021-04-27 MED ORDER — SERTRALINE HCL 50 MG PO TABS
50.0000 mg | ORAL_TABLET | Freq: Every day | ORAL | Status: DC
  Administered 2021-04-27 – 2021-04-28 (×2): 50 mg via ORAL
  Filled 2021-04-27 (×2): qty 1

## 2021-04-27 MED ORDER — ACETAMINOPHEN 325 MG PO TABS: 650.0000 mg | ORAL_TABLET | ORAL | Status: DC | PRN

## 2021-04-27 MED ORDER — DOCUSATE SODIUM 100 MG PO CAPS
100.0000 mg | ORAL_CAPSULE | Freq: Two times a day (BID) | ORAL | Status: DC
  Administered 2021-04-27 – 2021-04-28 (×2): 100 mg via ORAL
  Filled 2021-04-27 (×2): qty 1

## 2021-04-27 MED ORDER — CEFAZOLIN SODIUM-DEXTROSE 2-4 GM/100ML-% IV SOLN
2.0000 g | INTRAVENOUS | Status: AC
  Administered 2021-04-27: 2 g via INTRAVENOUS
  Filled 2021-04-27: qty 100

## 2021-04-27 MED ORDER — FENTANYL CITRATE (PF) 100 MCG/2ML IJ SOLN
INTRAMUSCULAR | Status: AC
  Filled 2021-04-27: qty 2

## 2021-04-27 MED ORDER — KETAMINE HCL 50 MG/5ML IJ SOSY
PREFILLED_SYRINGE | INTRAMUSCULAR | Status: AC
  Filled 2021-04-27: qty 5

## 2021-04-27 MED ORDER — ATROPINE SULFATE 0.4 MG/ML IV SOLN
INTRAVENOUS | Status: DC | PRN
  Administered 2021-04-27: .3 mg via INTRAVENOUS
  Administered 2021-04-27: .1 mg via INTRAVENOUS

## 2021-04-27 MED ORDER — CHLORHEXIDINE GLUCONATE 0.12 % MT SOLN
OROMUCOSAL | Status: AC
  Administered 2021-04-27: 15 mL via OROMUCOSAL
  Filled 2021-04-27: qty 15

## 2021-04-27 MED ORDER — OXYBUTYNIN CHLORIDE 5 MG PO TABS: 5.0000 mg | ORAL_TABLET | Freq: Three times a day (TID) | ORAL | Status: DC | PRN

## 2021-04-27 MED ORDER — ROCURONIUM BROMIDE 100 MG/10ML IV SOLN
INTRAVENOUS | Status: DC | PRN
Start: 2021-04-27 — End: 2021-04-27
  Administered 2021-04-27: 10 mg via INTRAVENOUS
  Administered 2021-04-27: 20 mg via INTRAVENOUS
  Administered 2021-04-27: 10 mg via INTRAVENOUS
  Administered 2021-04-27: 50 mg via INTRAVENOUS
  Administered 2021-04-27 (×2): 10 mg via INTRAVENOUS

## 2021-04-27 MED ORDER — CHLORHEXIDINE GLUCONATE 0.12 % MT SOLN: 15.0000 mL | Freq: Once | OROMUCOSAL | Status: AC

## 2021-04-27 MED ORDER — KETAMINE HCL 10 MG/ML IJ SOLN
INTRAMUSCULAR | Status: DC | PRN
  Administered 2021-04-27: 10 mg via INTRAVENOUS
  Administered 2021-04-27: 20 mg via INTRAVENOUS

## 2021-04-27 MED ORDER — MIDAZOLAM HCL 2 MG/2ML IJ SOLN
INTRAMUSCULAR | Status: AC
  Filled 2021-04-27: qty 2

## 2021-04-27 MED ORDER — HYDROCHLOROTHIAZIDE 25 MG PO TABS
25.0000 mg | ORAL_TABLET | Freq: Every day | ORAL | Status: DC
Start: 2021-04-27 — End: 2021-04-28
  Administered 2021-04-27 – 2021-04-28 (×2): 25 mg via ORAL
  Filled 2021-04-27 (×2): qty 1

## 2021-04-27 MED ORDER — FENTANYL CITRATE (PF) 100 MCG/2ML IJ SOLN
INTRAMUSCULAR | Status: AC
  Administered 2021-04-27: 50 ug via INTRAVENOUS
  Filled 2021-04-27: qty 2

## 2021-04-27 MED ORDER — PHENYLEPHRINE 40 MCG/ML (10ML) SYRINGE FOR IV PUSH (FOR BLOOD PRESSURE SUPPORT)
PREFILLED_SYRINGE | INTRAVENOUS | Status: DC | PRN
Start: 2021-04-27 — End: 2021-04-27
  Administered 2021-04-27: 80 ug via INTRAVENOUS

## 2021-04-27 MED ORDER — BUPIVACAINE HCL 0.5 % IJ SOLN
INTRAMUSCULAR | Status: DC | PRN
Start: 2021-04-27 — End: 2021-04-27
  Administered 2021-04-27: 10 mL

## 2021-04-27 MED ORDER — LIDOCAINE HCL (CARDIAC) PF 100 MG/5ML IV SOSY
PREFILLED_SYRINGE | INTRAVENOUS | Status: DC | PRN
Start: 2021-04-27 — End: 2021-04-27
  Administered 2021-04-27: 50 mg via INTRAVENOUS

## 2021-04-27 MED ORDER — DEXAMETHASONE SODIUM PHOSPHATE 10 MG/ML IJ SOLN
INTRAMUSCULAR | Status: AC
  Filled 2021-04-27: qty 1

## 2021-04-27 MED ORDER — LISINOPRIL 20 MG PO TABS
20.0000 mg | ORAL_TABLET | Freq: Every day | ORAL | Status: DC
  Administered 2021-04-27 – 2021-04-28 (×2): 20 mg via ORAL
  Filled 2021-04-27 (×2): qty 1

## 2021-04-27 MED ORDER — CHLORHEXIDINE GLUCONATE CLOTH 2 % EX PADS
6.0000 | MEDICATED_PAD | Freq: Every day | CUTANEOUS | Status: DC
  Administered 2021-04-28: 6 via TOPICAL

## 2021-04-27 MED ORDER — ORAL CARE MOUTH RINSE: 15.0000 mL | Freq: Once | OROMUCOSAL | Status: AC

## 2021-04-27 MED ORDER — LACTATED RINGERS IV SOLN: INTRAVENOUS | Status: DC

## 2021-04-27 MED ORDER — DIPHENHYDRAMINE HCL 12.5 MG/5ML PO ELIX
12.5000 mg | ORAL_SOLUTION | Freq: Four times a day (QID) | ORAL | Status: DC | PRN
  Filled 2021-04-27: qty 5

## 2021-04-27 MED ORDER — FENTANYL CITRATE (PF) 100 MCG/2ML IJ SOLN
INTRAMUSCULAR | Status: DC | PRN
  Administered 2021-04-27 (×2): 50 ug via INTRAVENOUS

## 2021-04-27 MED ORDER — ONDANSETRON HCL 4 MG/2ML IJ SOLN: 4.0000 mg | INTRAMUSCULAR | Status: DC | PRN

## 2021-04-27 MED ORDER — PROPOFOL 10 MG/ML IV BOLUS
INTRAVENOUS | Status: AC
  Filled 2021-04-27: qty 40

## 2021-04-27 MED ORDER — BUPIVACAINE HCL (PF) 0.5 % IJ SOLN
INTRAMUSCULAR | Status: AC
  Filled 2021-04-27: qty 30

## 2021-04-27 MED ORDER — DEXMEDETOMIDINE HCL IN NACL 200 MCG/50ML IV SOLN
INTRAVENOUS | Status: DC | PRN
  Administered 2021-04-27: 8 ug via INTRAVENOUS
  Administered 2021-04-27: 12 ug via INTRAVENOUS

## 2021-04-27 MED ORDER — ACETAMINOPHEN 10 MG/ML IV SOLN
INTRAVENOUS | Status: AC
  Filled 2021-04-27: qty 100

## 2021-04-27 MED ORDER — ACETAMINOPHEN 10 MG/ML IV SOLN
INTRAVENOUS | Status: DC | PRN
  Administered 2021-04-27: 1000 mg via INTRAVENOUS

## 2021-04-27 MED ORDER — EPHEDRINE 5 MG/ML INJ
INTRAVENOUS | Status: AC
  Filled 2021-04-27: qty 5

## 2021-04-27 MED ORDER — DEXAMETHASONE SODIUM PHOSPHATE 10 MG/ML IJ SOLN
INTRAMUSCULAR | Status: DC | PRN
  Administered 2021-04-27: 10 mg via INTRAVENOUS

## 2021-04-27 SURGICAL SUPPLY — 89 items
ANCHOR TIS RET SYS 235ML (MISCELLANEOUS) ×3 IMPLANT
APPLICATOR SURGIFLO ENDO (HEMOSTASIS) ×3 IMPLANT
APPLIER CLIP LOGIC TI 5 (MISCELLANEOUS) IMPLANT
BAG URINE DRAIN 2000ML AR STRL (UROLOGICAL SUPPLIES) ×3 IMPLANT
BLADE CLIPPER SURG (BLADE) ×3 IMPLANT
BULB RESERV EVAC DRAIN JP 100C (MISCELLANEOUS) IMPLANT
CATH DRAINAGE MALECOT 26FR (CATHETERS) ×2 IMPLANT
CATH FOL 2WAY LX 18X5 (CATHETERS) ×5 IMPLANT
CATH MALECOT (CATHETERS) ×3
CLIP LIGATING HEM O LOK PURPLE (MISCELLANEOUS) ×11 IMPLANT
COVER TIP SHEARS 8 DVNC (MISCELLANEOUS) ×2 IMPLANT
COVER TIP SHEARS 8MM DA VINCI (MISCELLANEOUS) ×1
DEFOGGER SCOPE WARMER CLEARIFY (MISCELLANEOUS) ×3 IMPLANT
DERMABOND ADVANCED (GAUZE/BANDAGES/DRESSINGS) ×1
DERMABOND ADVANCED .7 DNX12 (GAUZE/BANDAGES/DRESSINGS) ×2 IMPLANT
DRAIN CHANNEL JP 15F RND 16 (MISCELLANEOUS) IMPLANT
DRAIN CHANNEL JP 19F (MISCELLANEOUS) IMPLANT
DRAPE 3/4 80X56 (DRAPES) ×3 IMPLANT
DRAPE ARM DVNC X/XI (DISPOSABLE) ×8 IMPLANT
DRAPE COLUMN DVNC XI (DISPOSABLE) ×2 IMPLANT
DRAPE DA VINCI XI ARM (DISPOSABLE) ×4
DRAPE DA VINCI XI COLUMN (DISPOSABLE) ×1
DRAPE LEGGINS SURG 28X43 STRL (DRAPES) ×3 IMPLANT
DRAPE SURG 17X11 SM STRL (DRAPES) ×12 IMPLANT
DRAPE UNDER BUTTOCK W/FLU (DRAPES) ×3 IMPLANT
DRSG TELFA 3X8 NADH (GAUZE/BANDAGES/DRESSINGS) ×3 IMPLANT
ELECT REM PT RETURN 9FT ADLT (ELECTROSURGICAL) ×3
ELECTRODE REM PT RTRN 9FT ADLT (ELECTROSURGICAL) ×2 IMPLANT
GLOVE SURG ENC MOIS LTX SZ6.5 (GLOVE) ×6 IMPLANT
GLOVE SURG UNDER POLY LF SZ7.5 (GLOVE) ×3 IMPLANT
GOWN STRL REUS W/ TWL LRG LVL3 (GOWN DISPOSABLE) ×4 IMPLANT
GOWN STRL REUS W/ TWL XL LVL3 (GOWN DISPOSABLE) ×2 IMPLANT
GOWN STRL REUS W/TWL LRG LVL3 (GOWN DISPOSABLE) ×2
GOWN STRL REUS W/TWL XL LVL3 (GOWN DISPOSABLE) ×1
GRASPER SUT TROCAR 14GX15 (MISCELLANEOUS) ×3 IMPLANT
HEMOSTAT SURGICEL 2X14 (HEMOSTASIS) IMPLANT
HOLDER FOLEY CATH W/STRAP (MISCELLANEOUS) ×3 IMPLANT
IRRIGATION STRYKERFLOW (MISCELLANEOUS) ×2 IMPLANT
IRRIGATOR STRYKERFLOW (MISCELLANEOUS) ×3
KIT PINK PAD W/HEAD ARE REST (MISCELLANEOUS) ×3
KIT PINK PAD W/HEAD ARM REST (MISCELLANEOUS) ×2 IMPLANT
LABEL OR SOLS (LABEL) ×3 IMPLANT
MANIFOLD NEPTUNE II (INSTRUMENTS) ×3 IMPLANT
MARKER SKIN DUAL TIP RULER LAB (MISCELLANEOUS) ×3 IMPLANT
NDL INSUFFLATION 14GA 120MM (NEEDLE) ×2 IMPLANT
NEEDLE HYPO 22GX1.5 SAFETY (NEEDLE) ×3 IMPLANT
NEEDLE INSUFFLATION 14GA 120MM (NEEDLE) ×3 IMPLANT
NS IRRIG 500ML POUR BTL (IV SOLUTION) ×2 IMPLANT
OBTURATOR OPTICAL STANDARD 8MM (TROCAR) ×1
OBTURATOR OPTICAL STND 8 DVNC (TROCAR) ×2
OBTURATOR OPTICALSTD 8 DVNC (TROCAR) ×2 IMPLANT
PACK LAP CHOLECYSTECTOMY (MISCELLANEOUS) ×3 IMPLANT
PAD DRESSING TELFA 3X8 NADH (GAUZE/BANDAGES/DRESSINGS) ×2 IMPLANT
PENCIL ELECTRO HAND CTR (MISCELLANEOUS) ×3 IMPLANT
RELOAD STAPLE 60 2.6 WHT THN (STAPLE) IMPLANT
RELOAD STAPLER WHITE 60MM (STAPLE) ×4 IMPLANT
SEAL CANN UNIV 5-8 DVNC XI (MISCELLANEOUS) ×8 IMPLANT
SEAL XI 5MM-8MM UNIVERSAL (MISCELLANEOUS) ×4
SET TUBE SMOKE EVAC HIGH FLOW (TUBING) ×3 IMPLANT
SOLUTION ELECTROLUBE (MISCELLANEOUS) ×3 IMPLANT
SPONGE T-LAP 4X18 ~~LOC~~+RFID (SPONGE) ×3 IMPLANT
SPONGE VERSALON 4X4 4PLY (MISCELLANEOUS) IMPLANT
STAPLE ECHEON FLEX 60 POW ENDO (STAPLE) ×1 IMPLANT
STAPLER RELOAD WHITE 60MM (STAPLE) ×6
STAPLER SKIN PROX 35W (STAPLE) ×3 IMPLANT
STRAP SAFETY 5IN WIDE (MISCELLANEOUS) ×4 IMPLANT
SURGIFLO W/THROMBIN 8M KIT (HEMOSTASIS) ×3 IMPLANT
SURGILUBE 2OZ TUBE FLIPTOP (MISCELLANEOUS) ×3 IMPLANT
SUT DVC VLOC 90 3-0 CV23 UNDY (SUTURE) ×6 IMPLANT
SUT DVC VLOC 90 3-0 CV23 VLT (SUTURE) ×3
SUT ETHILON 3-0 FS-10 30 BLK (SUTURE)
SUT MNCRL 4-0 (SUTURE) ×2
SUT MNCRL 4-0 27XMFL (SUTURE) ×4
SUT SILK 2 0 SH (SUTURE) IMPLANT
SUT VIC AB 0 CT1 36 (SUTURE) ×6 IMPLANT
SUT VIC AB 2-0 SH 27 (SUTURE)
SUT VIC AB 2-0 SH 27XBRD (SUTURE) IMPLANT
SUT VICRYL 0 AB UR-6 (SUTURE) ×3 IMPLANT
SUTURE DVC VLC 90 3-0 CV23 VLT (SUTURE) ×2 IMPLANT
SUTURE EHLN 3-0 FS-10 30 BLK (SUTURE) IMPLANT
SUTURE MNCRL 4-0 27XMF (SUTURE) ×4 IMPLANT
SYR 10ML LL (SYRINGE) ×3 IMPLANT
SYR BULB IRRIG 60ML STRL (SYRINGE) ×3 IMPLANT
SYR TOOMEY 50ML (SYRINGE) ×6 IMPLANT
TAPE CLOTH 3X10 WHT NS LF (GAUZE/BANDAGES/DRESSINGS) ×5 IMPLANT
TROCAR XCEL 12X100 BLDLESS (ENDOMECHANICALS) ×3 IMPLANT
TROCAR XCEL NON-BLD 5MMX100MML (ENDOMECHANICALS) ×3 IMPLANT
WATER STERILE IRR 3000ML UROMA (IV SOLUTION) ×3 IMPLANT
WATER STERILE IRR 500ML POUR (IV SOLUTION) ×3 IMPLANT

## 2021-04-27 NOTE — Op Note (Signed)
04/27/21  PREOPERATIVE DIAGNOSIS: Prostate cancer.  POSTOPERATIVE DIAGNOSIS: Prostate cancer.  OPERATION PERFORMED: 1. DaVinci laparoscopic radical prostatectomy (non nerve sparing) 2 DaVinci laproscopic bilateral pelvic lymph node dissection.  SURGEON: Hollice Espy, MD  ASSISTANTS: Nickolas Madrid, MD  ANESTHESIA: General.  EBL: 100 cc  SPECIMEN: Prostate with bilateral seminal vesicals, bilateral pelvic lymph nodes, anterior fat pad.  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 Treatment options were discussed with him at length and he chose DaVinci radical prostatectomy.  Very poor baseline erections therefore plan for non nerve sparing was discussed. Bilateral pelvic lymph node dissection was planned due to his risk stratification.  PROCEDURE IN DETAIL: Patient was given appropriate perioperative antibiotics. He had sequential compression devices applied preoperatively for DVT prophylaxis. He was taken to the operating room where he was induced with general anesthesia. After adequate anesthesia, he was placed in the dorsal lithotomy position. His arms were draped by his side and was appropriately padded and secured to the operating room table. He was placed in the Trendelenburg position.  He was prepped and draped in sterile fashion. An 45 French Foley was placed in the bladder and instilled with 15 cc sterile water. Orogastric tube was placed. The Veress needle was passed just above the umbilicus and the abdomen was insufflated to 15 atmospheres. An 8 mm, blunt-tip trocar was placed just above the umbilicus. The zero-degree camera was passed within this and the following trocars were placed under direct vision; 8 mm robotic trocars were placed 9 cm laterally and inferiorly to the initially placed umbilical trocar. A third one was placed 7 cm lateral to the  left-sided trocar. In the corresponding position on the right side, a 12 mm trocar was placed, and then a 5 mm trocar was placed to the right and well above the umbilicus.  The 12 mm assistant port site was preclosed using 0 Vicryl suture using a Carter-Thomason which was tied down at the end of the case to close this port site.  The robot was then docked with the robot trocar. I used the zero-degree camera. I had the hot scissors in the right hand and the left hand with the [place] bipolar and far left hand the Prograsp forceps. Initially I divided the median umbilical ligament bilaterally and the urachus and developed the space of Retzius down to pubic bone. I divided the parietal peritoneum laterally up to the vas deferens on each side. I used the prograsp forceps to provide cranial traction on the urachus. I cleaned off the Endopelvic fascia on each side and then divided it with the scissors laterally to the perirectal fat and medially to the puboprostatic ligaments which were divided. I then ligated the dorsal vein complex using a 60 mm vascular load stapler.   I then addressed the bladder neck with a 30-degree down lens. I identified the bladder neck by pulling on the Foley catheter. I divided the anterior bladder neck musculature until I then found the anterior bladder neck mucosa which was incised. I identified the Foley catheter within, deflated the balloon, pulled the Foley out through this opening and then using the Carter-Thomason needle with a #0-Vicryl suture, passed through The suprapubic region and pulled the suture through the eye of the Foley and then back out. This allowed me to provide upward traction on the prostate. I then divided the lateral bladder neck mucosa and the posterior bladder neck mucosa. I was well away from  ureteral orifices. I divided the posterior bladder neck musculature until I identified the vas deferens. They were freed proximally, then  divided. I freed up the seminal vesicals using blunt and sharp dissection. Judicious use of electrocautery was used near the seminal vesicle tips to avoid injury to neurovascular bundle.   I then went back to the 0-degree lens. I divided the Denonvilliers fascia beneath the prostate and developed the prostate off the rectum. I then did a non nerve sparing by  creating a plane which was extrafascial. I then isolated the pedicles of the prostate and placed weck clips on the pedicles of the prostate and then divided it with cold scissors. I continued to divide the neurovascular bundles off the prostate out to the apex of the prostate. At this point the prostate was freed up except for the urethra. I addressed the prostate anteriorly, divided the dorsal vein , then the anterior urethral wall, pulled the Foley catheter back and then divided the posterior urethral wall. Specimen was completely freed up. I placed the prostate in an Endo catch bag and then placed the bag in the upper abdomen out of the way. I then irrigated the pelvis. The rectal test was negative. There was reasonable hemostasis.  I then did the pelvic lymph node dissection by incising the fascia overlying the right external iliac vein, dissecting distally. I went just distal to the node of [place] where we placed clips and then divided the lymphatics. The lateral aspect of the dissection was the pelvic side wall, inferior was the obturator nerve and proximal the hypogastric vessels. I placed clips at the proximal aspect and then divided the lymphatics. This was removed with the spoon grasper and sent to pathology.   I then did the left obturator lymph node dissection in the same fashion as the left side.  With good hemostasis, I then did the posterior reconstruction. I used a 3-0 VLoc suture through the cut edge of Denonvilliers fascia beneath the bladder on the right side and through the posterior striated sphincter  underneath the urethra. This brought the bladder neck and urethra and closer proximity to help facilitate anastomosis.   I then did the urethral vesicle anastomosis again with two 3-0 VLoc sutures interlocked. I passed both ends of the suture from the outside-in through the bladder neck at the 6 o'clock position. I passed both through the urethral stump from the inside-out in the corresponding position. I reapproximated the bladder neck to the urethra. I then ran the Left suture on the left side anastomosis to the 9 o'clock position. Then I went back to the right sided suture and ran that up the right side to the 12 o'clock position. I then continued the left suture to the 12 o'clock position.   I then placed a new 12 French Foley into the bladder and filled it with 10 cc sterile water. I irrigated the bladder with 160 cc. There was no leakage. There was reasonable hemostasis.  Surgicel was used on either side of the pedicles for an additional hemostasis.  Surgi-Flo was also used. The instruments were then removed. The robot was undocked and all the trocars were removed under direct vision. There was good hemostasis. I then enlarged the umbilical trocar site large enough to remove the prostate and I closed the fascia here with #0-0 Vicryl suture in a running fashion. All the port sites were irrigated. Lidocaine was injected into all the trocar sites. The skin was closed with 4-0 Monocryl in running  subcuticular fashion. Dermabond was applied.   At this point patient was awakened and extubated in the operating room and taken to the recovery room in stable condition. There were no complications. All counts correct.  An assistant was required for this surgical procedure. The duties of the assistant included but were not limited to suctioning, passing suture, camera manipulation, retraction. This procedure would not be able to be performed without an Environmental consultant.    Hollice Espy,  MD

## 2021-04-27 NOTE — Telephone Encounter (Signed)
Error

## 2021-04-27 NOTE — Transfer of Care (Signed)
Immediate Anesthesia Transfer of Care Note  Patient: Alejandro Winters  Procedure(s) Performed: XI ROBOTIC ASSISTED LAPAROSCOPIC RADICAL PROSTATECTOMY PELVIC LYMPH NODE DISSECTION (Bilateral)  Patient Location: PACU  Anesthesia Type:General  Level of Consciousness: drowsy  Airway & Oxygen Therapy: Patient connected to face mask oxygen  Post-op Assessment: Report given to RN  Post vital signs: stable  Last Vitals:  Vitals Value Taken Time  BP    Temp    Pulse    Resp    SpO2      Last Pain:  Vitals:   04/27/21 0847  TempSrc: Oral  PainSc: 0-No pain         Complications: No notable events documented.

## 2021-04-27 NOTE — Anesthesia Postprocedure Evaluation (Signed)
Anesthesia Post Note  Patient: Alejandro Winters  Procedure(s) Performed: XI ROBOTIC ASSISTED LAPAROSCOPIC RADICAL PROSTATECTOMY PELVIC LYMPH NODE DISSECTION (Bilateral)  Patient location during evaluation: PACU Anesthesia Type: General Level of consciousness: awake and alert Pain management: pain level controlled Vital Signs Assessment: post-procedure vital signs reviewed and stable Respiratory status: spontaneous breathing, nonlabored ventilation, respiratory function stable and patient connected to nasal cannula oxygen Cardiovascular status: blood pressure returned to baseline and stable Postop Assessment: no apparent nausea or vomiting Anesthetic complications: no   No notable events documented.   Last Vitals:  Vitals:   04/27/21 1409 04/27/21 1414  BP: 104/61 104/61  Pulse: 62 61  Resp: 20 20  Temp: 36.5 C 36.5 C  SpO2: 97% 99%    Last Pain:  Vitals:   04/27/21 1409  TempSrc: Oral  PainSc:                  Martha Clan

## 2021-04-27 NOTE — Anesthesia Procedure Notes (Signed)
Procedure Name: Intubation Date/Time: 04/27/2021 9:23 AM Performed by: Kerri Perches, CRNA Pre-anesthesia Checklist: Patient identified, Patient being monitored, Timeout performed, Emergency Drugs available and Suction available Patient Re-evaluated:Patient Re-evaluated prior to induction Oxygen Delivery Method: Circle system utilized Preoxygenation: Pre-oxygenation with 100% oxygen Induction Type: IV induction Ventilation: Mask ventilation without difficulty Laryngoscope Size: 3 and McGraph Grade View: Grade I Tube type: Oral Tube size: 7.0 mm Number of attempts: 1 Airway Equipment and Method: Stylet Placement Confirmation: ETT inserted through vocal cords under direct vision, positive ETCO2 and breath sounds checked- equal and bilateral Secured at: 23 cm Tube secured with: Tape Dental Injury: Teeth and Oropharynx as per pre-operative assessment

## 2021-04-27 NOTE — Telephone Encounter (Signed)
Patient wife called in today and states he is having pain at his penis. His urine is draining into the bag. No fever or chili's. Advised wife that he will have some discomfort from the cathter.  Patient wife want to know why did he not get any pain medication?

## 2021-04-27 NOTE — Anesthesia Preprocedure Evaluation (Signed)
Anesthesia Evaluation  Patient identified by MRN, date of birth, ID band Patient awake    Reviewed: Allergy & Precautions, NPO status , Patient's Chart, lab work & pertinent test results  History of Anesthesia Complications Negative for: history of anesthetic complications  Airway Mallampati: II       Dental  (+) Missing, Chipped, Dental Advidsory Given   Pulmonary neg shortness of breath, sleep apnea and Continuous Positive Airway Pressure Ventilation , neg COPD, neg recent URI, former smoker,           Cardiovascular hypertension, Pt. on medications (-) angina+CHF  (-) Past MI and (-) Cardiac Stents (-) dysrhythmias (-) Valvular Problems/Murmurs  ECHO from 1779: MILD LV SYSTOLIC DYSFUNCTION (See above) WITH MILD LVH  MILD VALVULAR REGURGITATION (See above)  NO VALVULAR STENOSIS  EF 45-50%     Neuro/Psych PSYCHIATRIC DISORDERS Depression negative neurological ROS     GI/Hepatic negative GI ROS, Neg liver ROS,   Endo/Other  negative endocrine ROS  Renal/GU CRFRenal disease     Musculoskeletal   Abdominal   Peds  Hematology  (+) Blood dyscrasia, anemia ,   Anesthesia Other Findings Past Medical History: No date: Cancer ([place]) No date: CHF (congestive heart failure) ([facility]) No date: Depression No date: Hyperlipemia No date: Hypertension No date: Kidney failure No date: Sleep apnea No date: Vitiligo   Reproductive/Obstetrics                             Anesthesia Physical  Anesthesia Plan  ASA: 3  Anesthesia Plan: General   Post-op Pain Management:    Induction: Intravenous  PONV Risk Score and Plan: 2 and Ondansetron, Dexamethasone, Midazolam and Treatment may vary due to age or medical condition  Airway Management Planned: Oral ETT  Additional Equipment:   Intra-op Plan:   Post-operative Plan: Extubation in OR  Informed Consent: I have reviewed the patients History  and Physical, chart, labs and discussed the procedure including the risks, benefits and alternatives for the proposed anesthesia with the patient or authorized representative who has indicated his/her understanding and acceptance.       Plan Discussed with:   Anesthesia Plan Comments:         Anesthesia Quick Evaluation

## 2021-04-27 NOTE — Interval H&P Note (Signed)
History and Physical Interval Note:  04/27/2021 8:45 AM  Alejandro Winters  has presented today for surgery, with the diagnosis of Prostate Cancer.  The various methods of treatment have been discussed with the patient and family. After consideration of risks, benefits and other options for treatment, the patient has consented to  Procedure(s): XI ROBOTIC ASSISTED LAPAROSCOPIC RADICAL PROSTATECTOMY (N/A) PELVIC LYMPH NODE DISSECTION (Bilateral) as a surgical intervention.  The patient's history has been reviewed, patient examined, no change in status, stable for surgery.  I have reviewed the patient's chart and labs.  Questions were answered to the patient's satisfaction.    RRR CTAB  Hollice Espy

## 2021-04-28 ENCOUNTER — Encounter: Payer: Self-pay | Admitting: Urology

## 2021-04-28 DIAGNOSIS — C61 Malignant neoplasm of prostate: Secondary | ICD-10-CM | POA: Diagnosis not present

## 2021-04-28 DIAGNOSIS — I130 Hypertensive heart and chronic kidney disease with heart failure and stage 1 through stage 4 chronic kidney disease, or unspecified chronic kidney disease: Secondary | ICD-10-CM | POA: Diagnosis not present

## 2021-04-28 DIAGNOSIS — I509 Heart failure, unspecified: Secondary | ICD-10-CM | POA: Diagnosis not present

## 2021-04-28 DIAGNOSIS — Z87891 Personal history of nicotine dependence: Secondary | ICD-10-CM | POA: Diagnosis not present

## 2021-04-28 DIAGNOSIS — N183 Chronic kidney disease, stage 3 unspecified: Secondary | ICD-10-CM | POA: Diagnosis not present

## 2021-04-28 LAB — BASIC METABOLIC PANEL
Anion gap: 8 (ref 5–15)
BUN: 16 mg/dL (ref 8–23)
CO2: 27 mmol/L (ref 22–32)
Calcium: 8.9 mg/dL (ref 8.9–10.3)
Chloride: 108 mmol/L (ref 98–111)
Creatinine, Ser: 1.32 mg/dL — ABNORMAL HIGH (ref 0.61–1.24)
GFR, Estimated: >60 mL/min (ref >60)
Glucose, Bld: 110 mg/dL — ABNORMAL HIGH (ref 70–99)
Potassium: 3.4 mmol/L — ABNORMAL LOW (ref 3.5–5.1)
Sodium: 143 mmol/L (ref 135–145)

## 2021-04-28 LAB — CBC
HCT: 33.6 % — ABNORMAL LOW (ref 39.0–52.0)
Hemoglobin: 11.3 g/dL — ABNORMAL LOW (ref 13.0–17.0)
MCH: 32.6 pg (ref 26.0–34.0)
MCHC: 33.6 g/dL (ref 30.0–36.0)
MCV: 96.8 fL (ref 80.0–100.0)
Platelets: 204 10*3/uL (ref 150–400)
RBC: 3.47 MIL/uL — ABNORMAL LOW (ref 4.22–5.81)
RDW: 11.8 % (ref 11.5–15.5)
WBC: 10 10*3/uL (ref 4.0–10.5)
nRBC: 0 % (ref 0.0–0.2)

## 2021-04-28 MED ORDER — OXYBUTYNIN CHLORIDE 5 MG PO TABS: 5.0000 mg | ORAL_TABLET | Freq: Three times a day (TID) | ORAL | 0 refills | Status: DC | PRN

## 2021-04-28 MED ORDER — DOCUSATE SODIUM 100 MG PO CAPS
100.0000 mg | ORAL_CAPSULE | Freq: Two times a day (BID) | ORAL | 0 refills | Status: DC
Start: 2021-04-28 — End: 2021-05-04

## 2021-04-28 MED ORDER — OXYCODONE-ACETAMINOPHEN 5-325 MG PO TABS: 1.0000 | ORAL_TABLET | ORAL | 0 refills | Status: DC | PRN

## 2021-04-28 NOTE — TOC CM/SW Note (Signed)
Patient has orders to discharge home today. Chart reviewed. PCP is Otilio Miu, MD. On room air. No discharge wound care needs. No TOC needs identified. CSW signing off.  Dayton Scrape, [place]

## 2021-04-28 NOTE — Discharge Instructions (Signed)
·   Activity:  You are encouraged to ambulate frequently (about every hour during waking hours) to help prevent blood clots from forming in your legs or lungs.  However, you should not engage in any heavy lifting (> 5-10 lbs), strenuous activity, or straining. ° °· Diet: You should advance your diet as instructed by your physician.  It will be normal to have some bloating, nausea, and abdominal discomfort intermittently. ° °· Prescriptions:  You will be provided a prescription for pain medication to take as needed.  If your pain is not severe enough to require the prescription pain medication, you may take extra strength Tylenol instead which will have less side effects.  You should also take a prescribed stool softener to avoid straining with bowel movements as the prescription pain medication may constipate you. ° °· Incisions: You may remove your dressing bandages 48 hours after surgery if not removed in the hospital.  You will either have some small staples or special tissue glue at each of the incision sites. Once the bandages are removed (if present), the incisions may stay open to air.  You may start showering (but not soaking or bathing in water) the 2nd day after surgery and the incisions simply need to be patted dry after the shower.  No additional care is needed. ° °What to call us about: You should call the office if you develop fever > 101 or develop persistent vomiting, redness or draining around your incision, or any other concerning symptoms.   ° °[facility] °[address], [address] °[place], [place] [address] °[phone] ° ° °

## 2021-04-28 NOTE — Progress Notes (Signed)
Urology Consult Follow Up  Subjective: POD #1 robotic prostatectomy bilateral pelvic lymph node dissection  Patient resting in bed comfortably.  He states his night was uneventful.    Has not passed flatus.  VSS afebrile  Morning labs still pending.  Foley catheter in place with good urine output draining clear orange urine.  Anti-infectives: Anti-infectives (From admission, onward)    Start     Dose/Rate Route Frequency Ordered Stop   04/27/21 0829  ceFAZolin (ANCEF) IVPB 2g/100 mL premix        2 g 200 mL/hr over 30 Minutes Intravenous 30 min pre-op 04/27/21 0829 04/27/21 0916       Current Facility-Administered Medications  Medication Dose Route Frequency Provider Last Rate Last Admin   0.9 %  sodium chloride infusion   Intravenous Continuous Hollice Espy, MD 125 mL/hr at 04/28/21 1096 Infusion Verify at 04/28/21 0454   acetaminophen (TYLENOL) tablet 650 mg  650 mg Oral Q4H PRN Hollice Espy, MD       Chlorhexidine Gluconate Cloth 2 % PADS 6 each  6 each Topical Daily Hollice Espy, MD       diphenhydrAMINE (BENADRYL) injection 12.5 mg  12.5 mg Intravenous Q6H PRN Hollice Espy, MD       Or   diphenhydrAMINE (BENADRYL) 12.5 MG/5ML elixir 12.5 mg  12.5 mg Oral Q6H PRN Hollice Espy, MD       docusate sodium (COLACE) capsule 100 mg  100 mg Oral BID Hollice Espy, MD   100 mg at 04/27/21 2144   heparin injection 5,000 Units  5,000 Units Subcutaneous Q8H Hollice Espy, MD   5,000 Units at 04/28/21 0534   hydrochlorothiazide (HYDRODIURIL) tablet 25 mg  25 mg Oral Daily Hollice Espy, MD   25 mg at 04/27/21 1505   lisinopril (ZESTRIL) tablet 20 mg  20 mg Oral Daily Hollice Espy, MD   20 mg at 04/27/21 1505   morphine (PF) 2 MG/ML injection 2-4 mg  2-4 mg Intravenous Q2H PRN Hollice Espy, MD   2 mg at 04/27/21 1657   ondansetron (ZOFRAN) injection 4 mg  4 mg Intravenous Q4H PRN Hollice Espy, MD       oxybutynin (DITROPAN) tablet 5 mg  5 mg Oral Q8H PRN  Hollice Espy, MD       oxyCODONE-acetaminophen (PERCOCET/ROXICET) 5-325 MG per tablet 1-2 tablet  1-2 tablet Oral Q4H PRN Hollice Espy, MD   1 tablet at 04/28/21 0430   pravastatin (PRAVACHOL) tablet 40 mg  40 mg Oral Daily Hollice Espy, MD   40 mg at 04/27/21 1505   sertraline (ZOLOFT) tablet 50 mg  50 mg Oral Daily Hollice Espy, MD   50 mg at 04/27/21 1505     Objective: Vital signs in last 24 hours: Temp:  [97.5 F (36.4 C)-99.5 F (37.5 C)] 98.9 F (37.2 C) (02/28 0436) Pulse Rate:  [61-83] 62 (02/28 0436) Resp:  [13-27] 18 (02/28 0436) BP: (100-144)/(56-97) 124/62 (02/28 0436) SpO2:  [94 %-99 %] 99 % (02/28 0436) Weight:  [122.5 kg] 122.5 kg (02/27 0847)  Intake/Output from previous day: 02/27 0701 - 02/28 0700 In: 1698.9 [P.O.:225; I.V.:1473.9] Out: 2925 [Urine:2825; Blood:100] Intake/Output this shift: No intake/output data recorded.   Physical Exam Constitutional:      Appearance: Normal appearance.  HENT:     Head: Normocephalic.     Nose: Nose normal.     Mouth/Throat:     Mouth: Mucous membranes are moist.  Eyes:     Extraocular Movements: Extraocular  movements intact.     Conjunctiva/sclera: Conjunctivae normal.     Pupils: Pupils are equal, round, and reactive to light.  Pulmonary:     Effort: Pulmonary effort is normal.  Abdominal:     Palpations: Abdomen is soft.     Tenderness: There is abdominal tenderness. There is no guarding or rebound.     Comments: Mild abdominal tenderness.  Port incisions are clean and dry.  Genitourinary:    Penis: Normal.      Comments: Foley in place draining dark orange clear urine Musculoskeletal:        General: Normal range of motion.     Cervical back: Normal range of motion.  Skin:    General: Skin is warm and dry.  Neurological:     General: No focal deficit present.     Mental Status: He is alert.  Psychiatric:        Mood and Affect: Mood normal.        Behavior: Behavior normal.        Thought  Content: Thought content normal.        Judgment: Judgment normal.    Lab Results:  No results for input(s): WBC, HGB, HCT, PLT in the last 72 hours. BMET No results for input(s): NA, K, CL, CO2, GLUCOSE, BUN, CREATININE, CALCIUM in the last 72 hours. PT/INR No results for input(s): LABPROT, INR in the last 72 hours. ABG No results for input(s): PHART, HCO3 in the last 72 hours.  Invalid input(s): PCO2, PO2  Studies/Results: No results found.   Assessment: 61 year old male with unfavorable intermediate risk prostate cancer who underwent robotic prostatectomy and bilateral pelvic lymph node dissection yesterday.   Plan: -Advance diet as tolerated -Use incentive spirometer every hour while awake -Ambulate x4 before lunch -Patient will be discharged with Foley catheter with follow-up in 1 week -Plans are for discharge today if tolerating diet, passing flatus, managing pain with p.o. pain medication and ambulating     LOS: 0 days    [facility] 04/28/2021

## 2021-04-28 NOTE — Discharge Summary (Signed)
Date of admission: 04/27/2021  Date of discharge: 04/28/2021  Admission diagnosis: Prostate cancer  Discharge diagnosis: Prostate cancer  Secondary diagnoses:  Patient Active Problem List   Diagnosis Date Noted   Prostate cancer ([place]) 04/27/2021   Major depressive disorder in partial remission ([place]) 01/13/2021   Elevated PSA 01/13/2021   Mixed hyperlipidemia 08/22/2017   Essential hypertension 09/24/2016   CKD (chronic kidney disease) stage 3, GFR 30-59 ml/min ([place]) 09/24/2016    History and Physical: For full details, please see admission history and physical. Briefly, Alejandro Winters is a 61 y.o. year old patient with unfavorable intermediate prostate cancer who underwent robotic prostatectomy and bilateral pelvic lymph node resection on 05/25/2021 with Dr. Erlene Quan.  Hospital Course: Patient tolerated the procedure well.  He was then transferred to the floor after an uneventful PACU stay.  His hospital course was uncomplicated.  On POD#1 he had met discharge criteria: was eating a regular diet, was up and ambulating independently,  pain was well controlled, passing flatus and was ready to for discharge.  Physical exam: Constitutional:  Well nourished. Alert and oriented, No acute distress. HEENT: [place] AT, moist mucus membranes.  Trachea midline Cardiovascular: No clubbing, cyanosis, or edema. Respiratory: Normal respiratory effort, no increased work of breathing. GU: No CVA tenderness.  No bladder fullness or masses.  Patient with circumcised phallus.  Urethral meatus is patent.  No penile discharge. No penile lesions or rashes. Foley in place draining clear yellow urine Neurologic: Grossly intact, no focal deficits, moving all 4 extremities. Psychiatric: Normal mood and affect.   Laboratory values:  Recent Labs    04/28/21 0853  WBC 10.0  HGB 11.3*  HCT 33.6*   Recent Labs    04/28/21 0853  NA 143  K 3.4*  CL 108  CO2 27  GLUCOSE 110*  BUN 16  CREATININE 1.32*  CALCIUM  8.9   No results for input(s): LABPT, INR in the last 72 hours. No results for input(s): LABURIN in the last 72 hours. Results for orders placed or performed during the hospital encounter of 04/24/21  SARS CORONAVIRUS 2 (TAT 6-24 HRS) Nasopharyngeal Nasopharyngeal Swab     Status: None   Collection Time: 04/24/21  9:40 AM   Specimen: Nasopharyngeal Swab  Result Value Ref Range Status   SARS Coronavirus 2 NEGATIVE NEGATIVE Final    Comment: (NOTE) SARS-CoV-2 target nucleic acids are NOT DETECTED.  The SARS-CoV-2 RNA is generally detectable in upper and lower respiratory specimens during the acute phase of infection. Negative results do not preclude SARS-CoV-2 infection, do not rule out co-infections with other pathogens, and should not be used as the sole basis for treatment or other patient management decisions. Negative results must be combined with clinical observations, patient history, and epidemiological information. The expected result is Negative.  Fact Sheet for Patients: SugarRoll.be  Fact Sheet for Healthcare Providers: https://www.woods-mathews.com/  This test is not yet approved or cleared by the [place] FDA and  has been authorized for detection and/or diagnosis of SARS-CoV-2 by FDA under an Emergency Use Authorization (EUA). This EUA will remain  in effect (meaning this test can be used) for the duration of the COVID-19 declaration under Se ction 564(b)(1) of the Act, 21 U.S.C. section 360bbb-3(b)(1), unless the authorization is terminated or revoked sooner.  Performed at [facility], [place] [address] [place], [place] [address]     Disposition: Home  Discharge instruction: The patient was instructed to be ambulatory but told to refrain from  heavy lifting, strenuous activity, or driving.   Discharge medications:  Allergies as of 04/28/2021       Reactions   Tamsulosin Hcl    Causes dizziness         Medication List     TAKE these medications    APPLE CIDER VINEGAR PO Take 450 mg by mouth daily.   docusate sodium 100 MG capsule Commonly known as: COLACE Take 1 capsule (100 mg total) by mouth 2 (two) times daily.   hydrochlorothiazide 25 MG tablet Commonly known as: HYDRODIURIL Take 1 tablet (25 mg total) by mouth daily.   lisinopril 20 MG tablet Commonly known as: ZESTRIL One a day   ONE-A-DAY MENS PO Take 1 tablet by mouth daily.   oxybutynin 5 MG tablet Commonly known as: DITROPAN Take 1 tablet (5 mg total) by mouth every 8 (eight) hours as needed for bladder spasms.   oxyCODONE-acetaminophen 5-325 MG tablet Commonly known as: PERCOCET/ROXICET Take 1-2 tablets by mouth every 4 (four) hours as needed for moderate pain.   pravastatin 40 MG tablet Commonly known as: PRAVACHOL Take 1 tablet (40 mg total) by mouth daily.   sertraline 50 MG tablet Commonly known as: ZOLOFT TAKE 1 TABLET BY MOUTH DAILY   sildenafil 20 MG tablet Commonly known as: REVATIO Take 2-5 tablets daily as needed   TURMERIC CURCUMIN PO Take 1-2 capsules by mouth daily. 2,250 mg per capsule        Followup:    As follow-up on May 04, 2021 at Dr. Cherrie Gauze office at the [facility] location at 8 AM.

## 2021-04-30 LAB — SURGICAL PATHOLOGY

## 2021-05-03 NOTE — Progress Notes (Incomplete)
05/04/2021 10:21 AM   Alejandro Winters Jun 20, 1960 [id number]  Referring provider: Juline Patch, MD [address] [place],  [place] [address]  Chief Complaint  Patient presents with   Follow-up    1 week cath removal    HPI: ***    Catheter Removal  Patient is present today for a catheter removal.  ***ml of water was drained from the balloon. A ***FR foley cath was removed from the bladder {dnt complications:20057} . Patient tolerated well.  Performed by: ***  Follow up/ Additional notes: ***   PMH: Past Medical History:  Diagnosis Date   Cancer ([place])    CHF (congestive heart failure) ([place])    Depression    Hyperlipemia    Hypertension    Kidney failure    Sleep apnea    Vitiligo     Surgical History: Past Surgical History:  Procedure Laterality Date   COLONOSCOPY WITH PROPOFOL N/A 12/16/2014   Procedure: COLONOSCOPY WITH PROPOFOL;  Surgeon: Josefine Class, MD;  [facility] END[facility]COPY;  Service: Endoscopy;  Laterality: N/A;   PELVIC LYMPH NODE DISSECTION Bilateral 04/27/2021   Procedure: PELVIC LYMPH NODE DISSECTION;  Surgeon: Hollice Espy, MD;  Location: [facility] ORS;  Service: Urology;  Laterality: Bilateral;   PROSTATE BIOPSY     ROBOT ASSISTED LAPAROSCOPIC RADICAL PROSTATECTOMY N/A 04/27/2021   Procedure: XI ROBOTIC ASSISTED LAPAROSCOPIC RADICAL PROSTATECTOMY;  Surgeon: Hollice Espy, MD;  Location: [facility] ORS;  Service: Urology;  Laterality: N/A;    Home Medications:  Allergies as of 05/04/2021       Reactions   Tamsulosin Hcl    Causes dizziness        Medication List        Accurate as of May 04, 2021 10:21 AM. If you have any questions, ask your nurse or doctor.          STOP taking these medications    sildenafil 20 MG tablet Commonly known as: REVATIO Stopped by: SHANNON MCGOWAN, PA-C       TAKE these medications    APPLE CIDER VINEGAR PO Take 450 mg by mouth daily.   docusate sodium 100 MG capsule Commonly  known as: COLACE Take 1 capsule (100 mg total) by mouth 2 (two) times daily.   hydrochlorothiazide 25 MG tablet Commonly known as: HYDRODIURIL Take 1 tablet (25 mg total) by mouth daily.   lisinopril 20 MG tablet Commonly known as: ZESTRIL One a day   ONE-A-DAY MENS PO Take 1 tablet by mouth daily.   oxybutynin 5 MG tablet Commonly known as: DITROPAN Take 1 tablet (5 mg total) by mouth every 8 (eight) hours as needed for bladder spasms.   oxyCODONE-acetaminophen 5-325 MG tablet Commonly known as: PERCOCET/ROXICET Take 1-2 tablets by mouth every 4 (four) hours as needed for moderate pain.   pravastatin 40 MG tablet Commonly known as: PRAVACHOL Take 1 tablet (40 mg total) by mouth daily.   sertraline 50 MG tablet Commonly known as: ZOLOFT TAKE 1 TABLET BY MOUTH DAILY   TURMERIC CURCUMIN PO Take 1-2 capsules by mouth daily. 2,250 mg per capsule        Allergies:  Allergies  Allergen Reactions   Tamsulosin Hcl     Causes dizziness    Family History: Family History  Problem Relation Age of Onset   Hypertension Mother    Cancer Father    Hypertension Father    Prostate cancer Neg Hx    Bladder Cancer Neg Hx  Kidney cancer Neg Hx     Social History:  reports that he quit smoking about 22 years ago. His smoking use included cigarettes. He has a 0.50 pack-year smoking history. He has never been exposed to tobacco smoke. He has never used smokeless tobacco. He reports current alcohol use. He reports current drug use. Drug: Marijuana.  ROS: Pertinent ROS in HPI  Physical Exam: There were no vitals taken for this visit.  Constitutional:  Well nourished. Alert and oriented, No acute distress. HEENT: [place] AT, moist mucus membranes.  Trachea midline, no masses. Cardiovascular: No clubbing, cyanosis, or edema. Respiratory: Normal respiratory effort, no increased work of breathing. GI: Abdomen is soft, non tender, non distended, no abdominal masses. Liver and spleen  not palpable.  No hernias appreciated.  Stool sample for occult testing is not indicated.   GU: No CVA tenderness.  No bladder fullness or masses.  Patient with circumcised/uncircumcised phallus. ***Foreskin easily retracted***  Urethral meatus is patent.  No penile discharge. No penile lesions or rashes. Scrotum without lesions, cysts, rashes and/or edema.  Testicles are located scrotally bilaterally. No masses are appreciated in the testicles. Left and right epididymis are normal. Rectal: Patient with  normal sphincter tone. Anus and perineum without scarring or rashes. No rectal masses are appreciated. Prostate is approximately *** grams, *** nodules are appreciated. Seminal vesicles are normal. Skin: No rashes, bruises or suspicious lesions. Lymph: No cervical or inguinal adenopathy. Neurologic: Grossly intact, no focal deficits, moving all 4 extremities. Psychiatric: Normal mood and affect.  Laboratory Data: Lab Results  Component Value Date   WBC 10.0 04/28/2021   HGB 11.3 (L) 04/28/2021   HCT 33.6 (L) 04/28/2021   MCV 96.8 04/28/2021   PLT 204 04/28/2021    Lab Results  Component Value Date   CREATININE 1.32 (H) 04/28/2021    Lab Results  Component Value Date   PSA 4.8 08/15/2020    Lab Results  Component Value Date   TESTOSTERONE 219 (L) 01/14/2021    Lab Results  Component Value Date   HGBA1C 4.7 08/15/2020    Lab Results  Component Value Date   TSH 2.05 08/15/2020       Component Value Date/Time   CHOL 160 01/14/2021 0901   HDL 32 (L) 01/14/2021 0901   CHOLHDL 5.0 11/02/2018 1004   LDLCALC 97 01/14/2021 0901    Lab Results  Component Value Date   AST 13 (A) 08/15/2020   Lab Results  Component Value Date   ALT 13 08/15/2020   No components found for: ALKALINEPHOPHATASE No components found for: BILIRUBINTOTAL  No results found for: ESTRADIOL  Urinalysis    Component Value Date/Time   COLORURINE YELLOW (A) 04/21/2021 0941   APPEARANCEUR CLEAR  (A) 04/21/2021 0941   LABSPEC 1.011 04/21/2021 0941   PHURINE 6.0 04/21/2021 [place] 04/21/2021 0941   HGBUR NEGATIVE 04/21/2021 0941   BILIRUBINUR NEGATIVE 04/21/2021 0941   KETONESUR NEGATIVE 04/21/2021 0941   PROTEINUR NEGATIVE 04/21/2021 0941   NITRITE NEGATIVE 04/21/2021 0941   LEUKOCYTESUR NEGATIVE 04/21/2021 0941    I have reviewed the labs.   Pertinent Imaging: '@CT'$ @ '@ultrasound'$ @ '@KUB'$ @ I have independently reviewed the films.    Assessment & Plan:  ***  There are no diagnoses linked to this encounter.  No follow-ups on file.  These notes generated with voice recognition software. I apologize for typographical errors.  Zara Council, [facility] [address]  [place], [place]  [address] °[phone] °  °

## 2021-05-04 ENCOUNTER — Encounter: Payer: Self-pay | Admitting: Urology

## 2021-05-04 ENCOUNTER — Other Ambulatory Visit: Payer: Self-pay

## 2021-05-04 ENCOUNTER — Ambulatory Visit (INDEPENDENT_AMBULATORY_CARE_PROVIDER_SITE_OTHER): Payer: 59 | Admitting: Urology

## 2021-05-04 VITALS — BP 137/77 | HR 60 | Ht 72.0 in | Wt 278.0 lb

## 2021-05-04 DIAGNOSIS — C61 Malignant neoplasm of prostate: Secondary | ICD-10-CM

## 2021-05-04 NOTE — Progress Notes (Signed)
Patient ID: Alejandro Winters, male   DOB: 1960/10/21, 61 y.o.   MRN: [id number] ?Catheter Removal ? ?Patient is present today for a catheter removal.  73m of water was drained from the balloon. A 18FR foley cath was removed from the bladder no complications were noted . Patient tolerated well. ? ?Performed by: DEdwin Dada CMA ? ? ? ?

## 2021-05-07 ENCOUNTER — Telehealth: Payer: Self-pay | Admitting: Urology

## 2021-05-07 NOTE — Telephone Encounter (Signed)
Alejandro Winters needs a follow up appointment with Dr. Erlene Quan in one month with PSA prior.  ?

## 2021-06-23 ENCOUNTER — Other Ambulatory Visit
Admission: RE | Admit: 2021-06-23 | Discharge: 2021-06-23 | Disposition: A | Discharge status: Home / Self Care | Payer: 59 | Attending: Urology | Admitting: Urology

## 2021-06-23 ENCOUNTER — Other Ambulatory Visit: Payer: Self-pay

## 2021-06-23 DIAGNOSIS — C61 Malignant neoplasm of prostate: Secondary | ICD-10-CM

## 2021-06-23 LAB — PSA: Prostatic Specific Antigen: <0.01 ng/mL (ref 0.00–4.00)

## 2021-06-23 NOTE — Addendum Note (Signed)
Addended by: Memory Argue H on: 06/23/2021 09:10 AM ? ? Modules accepted: Orders ? ?

## 2021-06-25 NOTE — Progress Notes (Signed)
? ?06/26/2021 ?3:36 PM  ? ?Alejandro Winters ?Nov 04, 1960 ?[id number] ? ?Referring provider:  ?Juline Patch, MD ?[address] ?[address] ?[place],  [place] [address] ?Chief Complaint  ?Patient presents with  ? Elevated PSA  ? ? ? ?HPI: ?Alejandro Winters is a 61 y.o.male with a personal history of unfavorable intermediate risk prostate cancer, BPH with obstructive urinary symptoms, CKD, and erectile dysfunction, who returns today for a  ? ?He underwent a prostate biopsy on 03/11/2021. Surgical pathology revealed Gleason 3+3 involving 3 cores affecting up to 80%, Gleason 3+4 involving 6 cores affecting up to 100%, Gleason 4+3 involving 1 core affecting up to 50%. TRUS volume was 48gm.   ?  ?He is s/p DaVinci laparoscopic radical prostatectomy and laparoscopic bilateral pelvic lymph node dissection on 04/27/2021. Surgical pathology was consistent with  fat pad and lymph nodes negative for malignancy. 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 With extraprostatic extension and right seminal vesicle invasion.  ? ?His most recent PSA was undetectable.  ? ?He reports that after his catheter was removed he had urinary leakage. He saw significant improvement in his urinary symptoms since catheter was removed. He reports that he has not  ? ?PMH: ?Past Medical History:  ?Diagnosis Date  ? Cancer [facility])   ? CHF (congestive heart failure) ([place])   ? Depression   ? Hyperlipemia   ? Hypertension   ? Kidney failure   ? Sleep apnea   ? Vitiligo   ? ? ?Surgical History: ?Past Surgical History:  ?Procedure Laterality Date  ? COLONOSCOPY WITH PROPOFOL N/A 12/16/2014  ? Procedure: COLONOSCOPY WITH PROPOFOL;  Surgeon: Josefine Class, MD;  Location: [facility] ENDOSCOPY;  Service: Endoscopy;  Laterality: N/A;  ? PELVIC LYMPH NODE DISSECTION Bilateral 04/27/2021  ? Procedure: PELVIC LYMPH NODE DISSECTION;  Surgeon: Hollice Espy, MD;  Location: [facility] ORS;  Service: Urology;  Laterality: Bilateral;  ? PROSTATE BIOPSY    ?  ROBOT ASSISTED LAPAROSCOPIC RADICAL PROSTATECTOMY N/A 04/27/2021  ? Procedure: XI ROBOTIC ASSISTED LAPAROSCOPIC RADICAL PROSTATECTOMY;  Surgeon: Hollice Espy, MD;  Location: [facility] ORS;  Service: Urology;  Laterality: N/A;  ? ? ?Home Medications:  ?Allergies as of 06/26/2021   ? ?   Reactions  ? Tamsulosin Hcl   ? Causes dizziness  ? ?  ? ?  ?Medication List  ?  ? ?  ? Accurate as of June 26, 2021  3:36 PM. If you have any questions, ask your nurse or doctor.  ?  ?  ? ?  ? ?APPLE CIDER VINEGAR PO ?Take 450 mg by mouth daily. ?  ?hydrochlorothiazide 25 MG tablet ?Commonly known as: HYDRODIURIL ?Take 1 tablet (25 mg total) by mouth daily. ?  ?lisinopril 20 MG tablet ?Commonly known as: ZESTRIL ?One a day ?  ?ONE-A-DAY MENS PO ?Take 1 tablet by mouth daily. ?  ?pravastatin 40 MG tablet ?Commonly known as: PRAVACHOL ?Take 1 tablet (40 mg total) by mouth daily. ?  ?sertraline 50 MG tablet ?Commonly known as: ZOLOFT ?TAKE 1 TABLET BY MOUTH DAILY ?  ?tadalafil 5 MG tablet ?Commonly known as: CIALIS ?Take 1 tablet (5 mg total) by mouth daily as needed for erectile dysfunction. ?Started by: Hollice Espy, MD ?  ?TURMERIC CURCUMIN PO ?Take 1-2 capsules by mouth daily. 2,250 mg per capsule ?  ? ?  ? ? ?Allergies:  ?Allergies  ?Allergen Reactions  ? Tamsulosin Hcl   ?  Causes dizziness  ? ? ?Family History: ?Family History  ?Problem  Relation Age of Onset  ? Hypertension Mother   ? Cancer Father   ? Hypertension Father   ? Prostate cancer Neg Hx   ? Bladder Cancer Neg Hx   ? Kidney cancer Neg Hx   ? ? ?Social History:  reports that he quit smoking about 22 years ago. His smoking use included cigarettes. He has a 0.50 pack-year smoking history. He has never been exposed to tobacco smoke. He has never used smokeless tobacco. He reports current alcohol use. He reports current drug use. Drug: Marijuana. ? ? ?Physical Exam: ?BP 137/83   Pulse 72   Ht 6' (1.829 m)   Wt 283 lb (128.4 kg)   BMI 38.38 kg/m?   ?Constitutional:  Alert  and oriented, No acute distress. ?HEENT: [place] AT, moist mucus membranes.  Trachea midline, no masses. ?Cardiovascular: No clubbing, cyanosis, or edema. ?Respiratory: Normal respiratory effort, no increased work of breathing. ?GU: abdominal incisions healing well ?Skin: No rashes, bruises or suspicious lesions. ?Neurologic: Grossly intact, no focal deficits, moving all 4 extremities. ?Psychiatric: Normal mood and affect. ? ?Laboratory Data: ? ?Lab Results  ?Component Value Date  ? CREATININE 1.32 (H) 04/28/2021  ? ?Lab Results  ?Component Value Date  ? HGBA1C 4.7 08/15/2020  ? ? ? ?Assessment & Plan:   ? ?Prostate cancer  ?- S/p robotic prostatectomy, pT3bN0 +ECE +SV - margins ?- PSA undetectable, high risk for recurrence but currently NED ?- Discussed common post-operative symptoms today ?-- He is clear to start exercising, heavy lifting etc.   ? ?2. Stress urinary incontinence ?- Strongly recommend pelvic floor exercises to help recover urinary control fast and strengthen pelvic floor muscles.  ? ?3. Erectile dysfunction ?- Discussed that he should do penile stretching to increase elasticity ?- Prescribed him cialis 5 mg daily.  ?-Consider injections or alternative treatments if interested ? ?Return in about 6 months (around 12/26/2021) for 74moPSA only, 1 year w/PSA prior. ? ?IConley Rollsas a scribe for AHollice Espy MD.,have documented all relevant documentation on the behalf of AHollice Espy MD,as directed by  AHollice Espy MD while in the presence of AHollice Espy MD. ? ?I have reviewed the above documentation for accuracy and completeness, and I agree with the above.  ? ?AHollice Espy MD ? ? ?[place]?[address] ?[place] [address]?[phone]? ?

## 2021-06-26 ENCOUNTER — Encounter: Payer: Self-pay | Admitting: Urology

## 2021-06-26 ENCOUNTER — Ambulatory Visit (INDEPENDENT_AMBULATORY_CARE_PROVIDER_SITE_OTHER): Payer: 59 | Admitting: Urology

## 2021-06-26 VITALS — BP 137/83 | HR 72 | Ht 72.0 in | Wt 283.0 lb

## 2021-06-26 DIAGNOSIS — R972 Elevated prostate specific antigen [PSA]: Secondary | ICD-10-CM

## 2021-06-26 DIAGNOSIS — Z8546 Personal history of malignant neoplasm of prostate: Secondary | ICD-10-CM

## 2021-06-26 DIAGNOSIS — N529 Male erectile dysfunction, unspecified: Secondary | ICD-10-CM

## 2021-06-26 DIAGNOSIS — C61 Malignant neoplasm of prostate: Secondary | ICD-10-CM

## 2021-06-26 DIAGNOSIS — N393 Stress incontinence (female) (male): Secondary | ICD-10-CM

## 2021-06-26 MED ORDER — TADALAFIL 5 MG PO TABS: 5.0000 mg | ORAL_TABLET | Freq: Every day | ORAL | 11 refills | Status: DC | PRN

## 2021-08-10 ENCOUNTER — Ambulatory Visit: Payer: 59 | Admitting: Urology

## 2021-08-14 ENCOUNTER — Other Ambulatory Visit: Payer: Self-pay

## 2021-08-14 DIAGNOSIS — E782 Mixed hyperlipidemia: Secondary | ICD-10-CM

## 2021-08-14 MED ORDER — PRAVASTATIN SODIUM 40 MG PO TABS: 40.0000 mg | ORAL_TABLET | Freq: Every day | ORAL | 0 refills | Status: DC

## 2021-09-09 ENCOUNTER — Encounter: Payer: Self-pay | Admitting: Urology

## 2021-10-13 ENCOUNTER — Other Ambulatory Visit: Payer: Self-pay | Admitting: Family Medicine

## 2021-10-13 DIAGNOSIS — E782 Mixed hyperlipidemia: Secondary | ICD-10-CM

## 2021-10-13 DIAGNOSIS — I10 Essential (primary) hypertension: Secondary | ICD-10-CM

## 2021-10-13 DIAGNOSIS — I5032 Chronic diastolic (congestive) heart failure: Secondary | ICD-10-CM

## 2021-10-13 DIAGNOSIS — F324 Major depressive disorder, single episode, in partial remission: Secondary | ICD-10-CM

## 2021-10-13 MED ORDER — PRAVASTATIN SODIUM 40 MG PO TABS: 40.0000 mg | ORAL_TABLET | Freq: Every day | ORAL | 0 refills | Status: DC

## 2021-10-13 MED ORDER — LISINOPRIL 20 MG PO TABS: ORAL_TABLET | ORAL | 0 refills | Status: DC

## 2021-10-13 MED ORDER — SERTRALINE HCL 50 MG PO TABS: ORAL_TABLET | ORAL | 0 refills | Status: DC

## 2021-10-13 MED ORDER — HYDROCHLOROTHIAZIDE 25 MG PO TABS: 25.0000 mg | ORAL_TABLET | Freq: Every day | ORAL | 0 refills | Status: DC

## 2021-10-13 NOTE — Telephone Encounter (Signed)
Medication Refill - Medication: pravastatin (PRAVACHOL) 40 MG tablet, lisinopril (ZESTRIL) 20 MG tablet, sertraline (ZOLOFT) 50 MG tablet, hydrochlorothiazide (HYDRODIURIL) 25 MG tablet  Has the patient contacted their pharmacy? Yes.    [id number] - [address], [place] [address] AT [place] Phone:  [phone]  Fax:  [phone]     Preferred Pharmacy (with phone number or street name):   Has the patient been seen for an appointment in the last year OR does the patient have an upcoming appointment? Yes.

## 2021-10-13 NOTE — Telephone Encounter (Signed)
Appt scheduled for this week, will fill rx.

## 2021-10-13 NOTE — Telephone Encounter (Signed)
Requested Prescriptions  Pending Prescriptions Disp Refills  . hydrochlorothiazide (HYDRODIURIL) 25 MG tablet 90 tablet 0    Sig: Take 1 tablet (25 mg total) by mouth daily.     Cardiovascular: Diuretics - Thiazide Failed - 10/13/2021  5:21 PM      Failed - Cr in normal range and within 180 days    Creatinine, Ser  Date Value Ref Range Status  04/28/2021 1.32 (H) 0.61 - 1.24 mg/dL Final         Failed - K in normal range and within 180 days    Potassium  Date Value Ref Range Status  04/28/2021 3.4 (L) 3.5 - 5.1 mmol/L Final         Failed - Valid encounter within last 6 months    Recent Outpatient Visits          9 months ago Essential hypertension   [place], Deanna C, MD   1 year ago Encounter to establish care   [facility] Juline Patch, MD   2 years ago Essential hypertension   [place], Deanna C, MD   3 years ago Essential hypertension   [place], Deanna C, MD   4 years ago Essential hypertension   [place], Deanna C, MD      Future Appointments            In 3 days Juline Patch, MD [facility]           Passed - Na in normal range and within 180 days    Sodium  Date Value Ref Range Status  04/28/2021 143 135 - 145 mmol/L Final  01/14/2021 147 (H) 134 - 144 mmol/L Final         Passed - Last BP in normal range    BP Readings from Last 1 Encounters:  06/26/21 137/83         . sertraline (ZOLOFT) 50 MG tablet 90 tablet 0    Sig: TAKE 1 TABLET BY MOUTH DAILY     Psychiatry:  Antidepressants - SSRI - sertraline Failed - 10/13/2021  5:21 PM      Failed - AST in normal range and within 360 days    AST  Date Value Ref Range Status  08/15/2020 13 (A) 14 - 40 Final         Failed - ALT in normal range and within 360 days    ALT  Date Value Ref Range Status  08/15/2020 13 10 - 40 Final         Failed - Valid encounter within last 6 months     Recent Outpatient Visits          9 months ago Essential hypertension   [place], Deanna C, MD   1 year ago Encounter to establish care   [place], MD   2 years ago Essential hypertension   [place], Deanna C, MD   3 years ago Essential hypertension   [place], Deanna C, MD   4 years ago Essential hypertension   [place], MD      Future Appointments            In 3 days Juline Patch, MD [facility] [place] - Completed PHQ-2 or PHQ-9 in  the last 360 days      . pravastatin (PRAVACHOL) 40 MG tablet 90 tablet 0    Sig: Take 1 tablet (40 mg total) by mouth daily.     Cardiovascular:  Antilipid - Statins Failed - 10/13/2021  5:21 PM      Failed - Lipid Panel in normal range within the last 12 months    Cholesterol, Total  Date Value Ref Range Status  01/14/2021 160 100 - 199 mg/dL Final   LDL Chol Calc (NIH)  Date Value Ref Range Status  01/14/2021 97 0 - 99 mg/dL Final   HDL  Date Value Ref Range Status  01/14/2021 32 (L) >39 mg/dL Final   Triglycerides  Date Value Ref Range Status  01/14/2021 178 (H) 0 - 149 mg/dL Final         Passed - Patient is not pregnant      Passed - Valid encounter within last 12 months    Recent Outpatient Visits          9 months ago Essential hypertension   [place], Deanna C, MD   1 year ago Encounter to establish care   [facility] Juline Patch, MD   2 years ago Essential hypertension   [place], Deanna C, MD   3 years ago Essential hypertension   [place], Deanna C, MD   4 years ago Essential hypertension   [place], Deanna C, MD      Future Appointments            In 3 days Juline Patch, MD [facility] [place]           . lisinopril (ZESTRIL) 20 MG tablet 90 tablet 0    Sig: One  a day     Cardiovascular:  ACE Inhibitors Failed - 10/13/2021  5:21 PM      Failed - Cr in normal range and within 180 days    Creatinine, Ser  Date Value Ref Range Status  04/28/2021 1.32 (H) 0.61 - 1.24 mg/dL Final         Failed - K in normal range and within 180 days    Potassium  Date Value Ref Range Status  04/28/2021 3.4 (L) 3.5 - 5.1 mmol/L Final         Failed - Valid encounter within last 6 months    Recent Outpatient Visits          9 months ago Essential hypertension   [place], Deanna C, MD   1 year ago Encounter to establish care   [facility] Juline Patch, MD   2 years ago Essential hypertension   [place], Deanna C, MD   3 years ago Essential hypertension   [place], Deanna C, MD   4 years ago Essential hypertension   [place], MD      Future Appointments            In 3 days Juline Patch, MD [facility] [place] - Patient is not pregnant      Passed - Last BP in normal range    BP Readings from Last 1 Encounters:  06/26/21 137/83

## 2021-10-16 ENCOUNTER — Ambulatory Visit (INDEPENDENT_AMBULATORY_CARE_PROVIDER_SITE_OTHER): Payer: 59 | Admitting: Family Medicine

## 2021-10-16 ENCOUNTER — Encounter: Payer: Self-pay | Admitting: Family Medicine

## 2021-10-16 VITALS — BP 130/80 | HR 68 | Ht 72.0 in | Wt 294.0 lb

## 2021-10-16 DIAGNOSIS — E782 Mixed hyperlipidemia: Secondary | ICD-10-CM

## 2021-10-16 DIAGNOSIS — R198 Other specified symptoms and signs involving the digestive system and abdomen: Secondary | ICD-10-CM | POA: Diagnosis not present

## 2021-10-16 DIAGNOSIS — I5032 Chronic diastolic (congestive) heart failure: Secondary | ICD-10-CM

## 2021-10-16 DIAGNOSIS — F324 Major depressive disorder, single episode, in partial remission: Secondary | ICD-10-CM

## 2021-10-16 DIAGNOSIS — Z1211 Encounter for screening for malignant neoplasm of colon: Secondary | ICD-10-CM

## 2021-10-16 DIAGNOSIS — G4733 Obstructive sleep apnea (adult) (pediatric): Secondary | ICD-10-CM | POA: Diagnosis not present

## 2021-10-16 DIAGNOSIS — I10 Essential (primary) hypertension: Secondary | ICD-10-CM | POA: Diagnosis not present

## 2021-10-16 DIAGNOSIS — R69 Illness, unspecified: Secondary | ICD-10-CM | POA: Diagnosis not present

## 2021-10-16 LAB — HEMOCCULT GUIAC POC 1CARD (OFFICE): Fecal Occult Blood, POC: NEGATIVE

## 2021-10-16 MED ORDER — SERTRALINE HCL 50 MG PO TABS: ORAL_TABLET | ORAL | 1 refills | Status: DC

## 2021-10-16 MED ORDER — PRAVASTATIN SODIUM 40 MG PO TABS: 40.0000 mg | ORAL_TABLET | Freq: Every day | ORAL | 1 refills | Status: DC

## 2021-10-16 MED ORDER — LISINOPRIL 20 MG PO TABS: ORAL_TABLET | ORAL | 1 refills | Status: DC

## 2021-10-16 MED ORDER — HYDROCHLOROTHIAZIDE 25 MG PO TABS: 25.0000 mg | ORAL_TABLET | Freq: Every day | ORAL | 1 refills | Status: DC

## 2021-10-16 NOTE — Progress Notes (Signed)
Date:  10/16/2021   Name:  Alejandro Winters   DOB:  1960/06/09   MRN:  [id number]   Chief Complaint: Hypertension, Hyperlipidemia, Depression, and sleep apnea (Pt has been sleeping with a CPAP for approx. 11 years. Has noticed, since switching masks, he doesn't feel as rested the next morning. "Once I get to going, I am fine." He also is experiencing falling asleep easily when he sits down to watch TV. )  Hypertension This is a chronic problem. The current episode started more than 1 year ago. The problem has been gradually improving since onset. The problem is controlled. Pertinent negatives include no anxiety, blurred vision, chest pain, headaches, malaise/fatigue, neck pain, orthopnea, palpitations, peripheral edema, PND, shortness of breath or sweats. There are no associated agents to hypertension. There are no known risk factors for coronary artery disease. Past treatments include ACE inhibitors and diuretics. The current treatment provides moderate improvement. There are no compliance problems.  There is no history of angina, kidney disease, CAD/MI, CVA, heart failure, left ventricular hypertrophy, PVD or retinopathy. There is no history of chronic renal disease, a hypertension causing med or renovascular disease.  Hyperlipidemia This is a chronic problem. The current episode started more than 1 year ago. The problem is controlled. Recent lipid tests were reviewed and are normal. He has no history of chronic renal disease, diabetes, hypothyroidism, liver disease, obesity or nephrotic syndrome. Pertinent negatives include no chest pain, focal sensory loss, focal weakness, leg pain, myalgias or shortness of breath. Current antihyperlipidemic treatment includes statins. The current treatment provides moderate improvement of lipids. There are no compliance problems.  Risk factors for coronary artery disease include dyslipidemia, male sex, hypertension and obesity.  Depression        This is a chronic  problem.  The current episode started more than 1 year ago.   The onset quality is gradual.   Associated symptoms include fatigue and insomnia.  Associated symptoms include no decreased concentration, no helplessness, no hopelessness, not irritable, no restlessness, no decreased interest, no appetite change, no body aches, no myalgias, no headaches, no indigestion, not sad and no suicidal ideas.  Past treatments include SSRIs - Selective serotonin reuptake inhibitors.  Compliance with treatment is good.  Previous treatment provided moderate relief.   Pertinent negatives include no hypothyroidism, no chronic illness, no dementia and no anxiety. GI Problem The primary symptoms include fatigue. Primary symptoms do not include fever, abdominal pain, nausea, melena, hematochezia, dysuria, myalgias or rash. The onset was sudden.  The illness does not include chills, dysphagia, constipation or back pain. Associated symptoms comments: Change in bowel habit/ribbon-like. Associated medical issues do not include inflammatory bowel disease or liver disease.    Lab Results  Component Value Date   NA 143 04/28/2021   K 3.4 (L) 04/28/2021   CO2 27 04/28/2021   GLUCOSE 110 (H) 04/28/2021   BUN 16 04/28/2021   CREATININE 1.32 (H) 04/28/2021   CALCIUM 8.9 04/28/2021   EGFR 52 (L) 01/14/2021   GFRNONAA >60 04/28/2021   Lab Results  Component Value Date   CHOL 160 01/14/2021   HDL 32 (L) 01/14/2021   LDLCALC 97 01/14/2021   TRIG 178 (H) 01/14/2021   CHOLHDL 5.0 11/02/2018   Lab Results  Component Value Date   TSH 2.05 08/15/2020   Lab Results  Component Value Date   HGBA1C 4.7 08/15/2020   Lab Results  Component Value Date   WBC 10.0 04/28/2021   HGB 11.3 (L) 04/28/2021  HCT 33.6 (L) 04/28/2021   MCV 96.8 04/28/2021   PLT 204 04/28/2021   Lab Results  Component Value Date   ALT 13 08/15/2020   AST 13 (A) 08/15/2020   ALKPHOS 59 08/15/2020   Lab Results  Component Value Date   VD25OH 30  08/15/2020     Review of Systems  Constitutional:  Positive for fatigue. Negative for appetite change, chills, fever and malaise/fatigue.  HENT:  Negative for ear pain and trouble swallowing.   Eyes:  Negative for blurred vision.  Respiratory:  Negative for cough, chest tightness, shortness of breath and wheezing.   Cardiovascular:  Negative for chest pain, palpitations, orthopnea, leg swelling and PND.  Gastrointestinal:  Negative for abdominal pain, blood in stool, constipation, dysphagia, hematochezia, melena and nausea.  Endocrine: Negative for polydipsia and polyuria.  Genitourinary:  Negative for dysuria, frequency, hematuria and urgency.  Musculoskeletal:  Negative for back pain, myalgias and neck pain.  Skin:  Negative for rash.  Allergic/Immunologic: Negative for environmental allergies.  Neurological:  Negative for dizziness, focal weakness and headaches.  Hematological:  Does not bruise/bleed easily.  Psychiatric/Behavioral:  Positive for depression. Negative for decreased concentration and suicidal ideas. The patient has insomnia. The patient is not nervous/anxious.     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   Elevated PSA 01/13/2021   Mixed hyperlipidemia 08/22/2017   Essential hypertension 09/24/2016   CKD (chronic kidney disease) stage 3, GFR 30-59 ml/min (HCC) 09/24/2016    Allergies  Allergen Reactions   Tamsulosin Hcl     Causes dizziness    Past Surgical History:  Procedure Laterality Date   COLONOSCOPY WITH PROPOFOL N/A 12/16/2014   Procedure: COLONOSCOPY WITH PROPOFOL;  Surgeon: Josefine Class, MD;  Location: [facility] ENDOSCOPY;  Service: Endoscopy;  Laterality: N/A;   PELVIC LYMPH NODE DISSECTION Bilateral 04/27/2021   Procedure: PELVIC LYMPH NODE DISSECTION;  Surgeon: Hollice Espy, MD;  Location: [facility] ORS;  Service: Urology;  Laterality:  Bilateral;   PROSTATE BIOPSY     ROBOT ASSISTED LAPAROSCOPIC RADICAL PROSTATECTOMY N/A 04/27/2021   Procedure: XI ROBOTIC ASSISTED LAPAROSCOPIC RADICAL PROSTATECTOMY;  Surgeon: Hollice Espy, MD;  Location: [facility] ORS;  Service: Urology;  Laterality: N/A;    Social History   Tobacco Use   Smoking status: Former    Packs/day: 0.50    Years: 1.00    Total pack years: 0.50    Types: Cigarettes    Quit date: 10/28/1998    Years since quitting: 22.9    Passive exposure: Never   Smokeless tobacco: Never  Vaping Use   Vaping Use: Never used  Substance Use Topics   Alcohol use: Yes    Alcohol/week: 0.0 standard drinks of alcohol   Drug use: Yes    Types: Marijuana    Comment: marijuana     Medication list has been reviewed and updated.  Current Meds  Medication Sig   hydrochlorothiazide (HYDRODIURIL) 25 MG tablet Take 1 tablet (25 mg total) by mouth daily.   lisinopril (ZESTRIL) 20 MG tablet One a day   Multiple Vitamin (ONE-A-DAY MENS PO) Take 1 tablet by mouth daily.   pravastatin (PRAVACHOL) 40 MG tablet Take 1 tablet (40 mg total) by mouth daily.   sertraline (ZOLOFT) 50 MG tablet TAKE 1 TABLET BY MOUTH DAILY   tadalafil (CIALIS) 5 MG tablet Take 1 tablet (5 mg total) by mouth daily as  needed for erectile dysfunction.   [DISCONTINUED] APPLE CIDER VINEGAR PO Take 450 mg by mouth daily.       10/16/2021   10:53 AM 01/13/2021   10:04 AM 09/18/2020   11:30 AM  GAD 7 : Generalized Anxiety Score  Nervous, Anxious, on Edge 0 1 0  Control/stop worrying 0 1 0  Worry too much - different things 0 1 0  Trouble relaxing 0 0 0  Restless 0 0 0  Easily annoyed or irritable 0 1 0  Afraid - awful might happen 0 1 0  Total GAD 7 Score 0 5 0  Anxiety Difficulty Not difficult at all Not difficult at all        10/16/2021   10:52 AM 01/13/2021   10:04 AM 09/18/2020   11:30 AM  Depression screen PHQ 2/9  Decreased Interest 0 0 0  Down, Depressed, Hopeless 1 1 0  PHQ - 2 Score 1 1 0   Altered sleeping 1 0   Tired, decreased energy 3 3   Change in appetite 0 1   Feeling bad or failure about yourself  0 2   Trouble concentrating 0 0   Moving slowly or fidgety/restless 0 0   Suicidal thoughts 0 1   PHQ-9 Score 5 8   Difficult doing work/chores Somewhat difficult Not difficult at all     BP Readings from Last 3 Encounters:  10/16/21 130/80  06/26/21 137/83  05/04/21 137/77    Physical Exam Vitals and nursing note reviewed.  Constitutional:      General: He is not irritable. HENT:     Head: Normocephalic.     Right Ear: Tympanic membrane and external ear normal.     Left Ear: Tympanic membrane and external ear normal.     Nose: Nose normal. No congestion.  Eyes:     General: No scleral icterus. Neck:     Thyroid: No thyromegaly.     Vascular: No JVD.     Trachea: No tracheal deviation.  Cardiovascular:     Rate and Rhythm: Normal rate and regular rhythm.     Heart sounds: Normal heart sounds, S1 normal and S2 normal. No murmur heard.    No systolic murmur is present.     No diastolic murmur is present.     No friction rub. No gallop. No S3 or S4 sounds.  Pulmonary:     Effort: No respiratory distress.     Breath sounds: Normal breath sounds. No decreased breath sounds, wheezing, rhonchi or rales.  Chest:     Chest wall: No mass.  Abdominal:     General: Bowel sounds are normal.     Palpations: Abdomen is soft. There is no mass.     Tenderness: There is no abdominal tenderness. There is no guarding or rebound.  Genitourinary:    Prostate: Normal. Not enlarged, not tender and no nodules present.     Rectum: Normal. Guaiac result negative. No mass.  Musculoskeletal:        General: No tenderness. Normal range of motion.     Cervical back: Neck supple.  Lymphadenopathy:     Cervical: No cervical adenopathy.  Skin:    General: Skin is warm.     Findings: No rash.  Neurological:     Mental Status: He is alert.     Wt Readings from Last 3  Encounters:  10/16/21 294 lb (133.4 kg)  06/26/21 283 lb (128.4 kg)  05/04/21 278 lb (126.1 kg)  BP 130/80   Pulse 68   Ht 6' (1.829 m)   Wt 294 lb (133.4 kg)   BMI 39.87 kg/m   Assessment and Plan:  1. Essential hypertension Chronic.  Controlled.  Stable.  Blood pressure 130/80.  Asymptomatic.  Tolerating medication well.  Continue hydrochlorothiazide 25 mg once a day and lisinopril 20 mg but check renal function panel for electrolytes and GFR.  We will recheck patient in 6 months. - hydrochlorothiazide (HYDRODIURIL) 25 MG tablet; Take 1 tablet (25 mg total) by mouth daily.  Dispense: 90 tablet; Refill: 1 - lisinopril (ZESTRIL) 20 MG tablet; One a day  Dispense: 90 tablet; Refill: 1 - Renal Function Panel  2. Chronic diastolic congestive heart failure (HCC) Chronic.  Controlled.  Stable.  Diastolic congestive heart failure history.  Previously followed by cardiology at [facility].  No DOE, orthopnea, or PND.  Lisinopril and hydrochlorothiazide at above dosing.  We will recheck in 6 months. - hydrochlorothiazide (HYDRODIURIL) 25 MG tablet; Take 1 tablet (25 mg total) by mouth daily.  Dispense: 90 tablet; Refill: 1 - lisinopril (ZESTRIL) 20 MG tablet; One a day  Dispense: 90 tablet; Refill: 1  3. Mixed hyperlipidemia Chronic.  Controlled.  Stable.  Continue pravastatin 40 mg once a day.  Will check lipid panel. - pravastatin (PRAVACHOL) 40 MG tablet; Take 1 tablet (40 mg total) by mouth daily.  Dispense: 90 tablet; Refill: 1 - Lipid Panel With LDL/HDL Ratio  4. Major depressive disorder in partial remission, unspecified whether recurrent ([place]) Controlled.  Stable.  PHQ is mostly for insomnia associated probably with OSA GAD score is 1 - sertraline (ZOLOFT) 50 MG tablet; TAKE 1 TABLET BY MOUTH DAILY  Dispense: 90 tablet; Refill: 1  5. Change in bowel function At about the same time that patient had robotic surgery for his prostate patient was noted change in bowel habits  presenting as ribbonlike stools.  On evaluation it is noted that he had colonoscopy in 2016 which was to be repeated in 5 years.  We will refer to gastroenterology for clinic and that Dr. Aaron Edelman had done his previous colonoscopy and he is no longer at the clinic. - Ambulatory referral to Gastroenterology  6. Colon cancer screening Above - Ambulatory referral to Gastroenterology - POCT Occult Blood Stool  7. OSA (obstructive sleep apnea) Patient has had increased somnolence during the day as well as.  Patient recently changed his mask from a facemask to a nasal and I feel like is probably Delivering the same amount of pressure as previous.  Patient's last obstructive sleep apnea study was over 11 years ago and we will refer to ENT because this may need to change not only in apparatus but also in mask as well as new settings. - Ambulatory referral to ENT    Otilio Miu, MD

## 2021-10-17 LAB — RENAL FUNCTION PANEL
Albumin: 4.9 g/dL (ref 3.9–4.9)
BUN/Creatinine Ratio: 10 (ref 10–24)
BUN: 15 mg/dL (ref 8–27)
CO2: 25 mmol/L (ref 20–29)
Calcium: 10.2 mg/dL (ref 8.6–10.2)
Chloride: 102 mmol/L (ref 96–106)
Creatinine, Ser: 1.44 mg/dL — ABNORMAL HIGH (ref 0.76–1.27)
Glucose: 101 mg/dL — ABNORMAL HIGH (ref 70–99)
Phosphorus: 4 mg/dL (ref 2.8–4.1)
Potassium: 3.8 mmol/L (ref 3.5–5.2)
Sodium: 142 mmol/L (ref 134–144)
eGFR: 55 mL/min/{1.73_m2} — ABNORMAL LOW (ref >59)

## 2021-10-17 LAB — LIPID PANEL WITH LDL/HDL RATIO
Cholesterol, Total: 182 mg/dL (ref 100–199)
HDL: 31 mg/dL — ABNORMAL LOW (ref >39)
LDL Chol Calc (NIH): 106 mg/dL — ABNORMAL HIGH (ref 0–99)
LDL/HDL Ratio: 3.4 ratio (ref 0.0–3.6)
Triglycerides: 262 mg/dL — ABNORMAL HIGH (ref 0–149)
VLDL Cholesterol Cal: 45 mg/dL — ABNORMAL HIGH (ref 5–40)

## 2021-10-19 ENCOUNTER — Other Ambulatory Visit: Payer: Self-pay

## 2021-10-19 ENCOUNTER — Encounter: Payer: Self-pay | Admitting: Family Medicine

## 2021-10-19 DIAGNOSIS — E782 Mixed hyperlipidemia: Secondary | ICD-10-CM

## 2021-10-19 MED ORDER — FENOFIBRATE 145 MG PO TABS: 145.0000 mg | ORAL_TABLET | Freq: Every day | ORAL | 1 refills | Status: DC

## 2021-11-10 DIAGNOSIS — G4733 Obstructive sleep apnea (adult) (pediatric): Secondary | ICD-10-CM | POA: Diagnosis not present

## 2021-11-11 ENCOUNTER — Telehealth: Payer: Self-pay | Admitting: Family Medicine

## 2021-11-11 NOTE — Telephone Encounter (Signed)
Copied from [place] [id number]. Topic: General - Other >> Nov 11, 2021 10:12 AM [place] B wrote: Reason for YDX:AJOINOM states received call today, not sure what it was about. Please call back

## 2021-12-21 ENCOUNTER — Other Ambulatory Visit: Payer: 59

## 2021-12-21 DIAGNOSIS — C61 Malignant neoplasm of prostate: Secondary | ICD-10-CM | POA: Diagnosis not present

## 2021-12-21 DIAGNOSIS — R972 Elevated prostate specific antigen [PSA]: Secondary | ICD-10-CM | POA: Diagnosis not present

## 2021-12-22 LAB — PSA: Prostate Specific Ag, Serum: <0.1 ng/mL (ref 0.0–4.0)

## 2022-01-08 DIAGNOSIS — G4733 Obstructive sleep apnea (adult) (pediatric): Secondary | ICD-10-CM | POA: Diagnosis not present

## 2022-02-15 ENCOUNTER — Ambulatory Visit: Payer: Self-pay | Admitting: *Deleted

## 2022-02-15 NOTE — Telephone Encounter (Signed)
Summary: Pt tested positive for Covid and he would like a return call   Pt stated he tested positive for Covid and he would like a return call to discuss what he should be doing. Cb# [phone]         Chief Complaint: covid positive test 02/14/22. Requesting if medication needed and advice Symptoms: fatigue, mild cough, non productive, headache on and off. Nasal congestion blows nose for white mucus. Eyes watery. Hx wears CPAP Frequency: sx started 02/13/22. Pertinent Negatives: Patient denies chest pain no difficulty breathing no fever Disposition: '[]'$ ED /'[]'$ Urgent Care (no appt availability in office) / '[x]'$ Appointment(In office/virtual)/ '[]'$  [facility]/ '[]'$ Home Care/ '[]'$ Refused Recommended Disposition /'[]'$ [place] [facility]/ '[]'$  Follow-up with PCP Additional Notes:   My chart VV scheduled for tomorrow.        Reason for Disposition  [1] HIGH RISK patient (e.g., weak immune system, age > 70 years, obesity with BMI 30 or higher, pregnant, chronic lung disease or other chronic medical condition) AND [2] COVID symptoms (e.g., cough, fever)  (Exceptions: Already seen by PCP and no new or worsening symptoms.)  Answer Assessment - Initial Assessment Questions 1. COVID-19 DIAGNOSIS: "How do you know that you have COVID?" (e.g., positive lab test or self-test, diagnosed by doctor or NP/PA, symptoms after exposure).     At home test covid postive 02/14/22 , used covid test that he is not sure if outdated .  2. COVID-19 EXPOSURE: "Was there any known exposure to COVID before the symptoms began?" CDC Definition of close contact: within 6 feet (2 meters) for a total of 15 minutes or more over a 24-hour period.      na 3. ONSET: "When did the COVID-19 symptoms start?"      02/13/22 4. WORST SYMPTOM: "What is your worst symptom?" (e.g., cough, fever, shortness of breath, muscle aches)     Nasal congestion cough, feels tired.  5. COUGH: "Do you have a cough?" If Yes, ask: "How bad is  the cough?"       Yes mild 6. FEVER: "Do you have a fever?" If Yes, ask: "What is your temperature, how was it measured, and when did it start?"     na 7. RESPIRATORY STATUS: "Describe your breathing?" (e.g., normal; shortness of breath, wheezing, unable to speak)      Normal  8. BETTER-SAME-WORSE: "Are you getting better, staying the same or getting worse compared to yesterday?"  If getting worse, ask, "In what way?"     na 9. OTHER SYMPTOMS: "Do you have any other symptoms?"  (e.g., chills, fatigue, headache, loss of smell or taste, muscle pain, sore throat)     Headache, fatigue, mild cough ,  10. HIGH RISK DISEASE: "Do you have any chronic medical problems?" (e.g., asthma, heart or lung disease, weak immune system, obesity, etc.)       Uses CPAP  11. VACCINE: "Have you had the COVID-19 vaccine?" If Yes, ask: "Which one, how many shots, when did you get it?"       Yes boosters done  12. PREGNANCY: "Is there any chance you are pregnant?" "When was your last menstrual period?"       na 13. O2 SATURATION MONITOR:  "Do you use an oxygen saturation monitor (pulse oximeter) at home?" If Yes, ask "What is your reading (oxygen level) today?" "What is your usual oxygen saturation reading?" (e.g., 95%)       na  Protocols used: Coronavirus (COVID-19) Diagnosed or Suspected-A-AH

## 2022-02-16 ENCOUNTER — Telehealth: Payer: 59 | Admitting: Family Medicine

## 2022-02-16 ENCOUNTER — Telehealth: Payer: Self-pay

## 2022-02-16 NOTE — Telephone Encounter (Signed)
Called pt and told him to pick up mucinex D and flonase for symptoms.

## 2022-04-19 ENCOUNTER — Encounter: Payer: Self-pay | Admitting: Family Medicine

## 2022-04-19 ENCOUNTER — Ambulatory Visit (INDEPENDENT_AMBULATORY_CARE_PROVIDER_SITE_OTHER): Payer: 59 | Admitting: Family Medicine

## 2022-04-19 VITALS — BP 128/78 | HR 68 | Ht 72.0 in | Wt 303.0 lb

## 2022-04-19 DIAGNOSIS — I5032 Chronic diastolic (congestive) heart failure: Secondary | ICD-10-CM | POA: Diagnosis not present

## 2022-04-19 DIAGNOSIS — F324 Major depressive disorder, single episode, in partial remission: Secondary | ICD-10-CM | POA: Diagnosis not present

## 2022-04-19 DIAGNOSIS — I10 Essential (primary) hypertension: Secondary | ICD-10-CM | POA: Diagnosis not present

## 2022-04-19 DIAGNOSIS — R69 Illness, unspecified: Secondary | ICD-10-CM | POA: Diagnosis not present

## 2022-04-19 DIAGNOSIS — E782 Mixed hyperlipidemia: Secondary | ICD-10-CM | POA: Diagnosis not present

## 2022-04-19 DIAGNOSIS — N529 Male erectile dysfunction, unspecified: Secondary | ICD-10-CM | POA: Diagnosis not present

## 2022-04-19 DIAGNOSIS — Z23 Encounter for immunization: Secondary | ICD-10-CM | POA: Diagnosis not present

## 2022-04-19 MED ORDER — LISINOPRIL 20 MG PO TABS: ORAL_TABLET | ORAL | 1 refills | Status: DC

## 2022-04-19 MED ORDER — TADALAFIL 5 MG PO TABS: 5.0000 mg | ORAL_TABLET | Freq: Every day | ORAL | 11 refills | Status: DC | PRN

## 2022-04-19 MED ORDER — HYDROCHLOROTHIAZIDE 25 MG PO TABS: 25.0000 mg | ORAL_TABLET | Freq: Every day | ORAL | 1 refills | Status: DC

## 2022-04-19 MED ORDER — SERTRALINE HCL 50 MG PO TABS: ORAL_TABLET | ORAL | 1 refills | Status: DC

## 2022-04-19 MED ORDER — FENOFIBRATE 145 MG PO TABS: 145.0000 mg | ORAL_TABLET | Freq: Every day | ORAL | 1 refills | Status: DC

## 2022-04-19 MED ORDER — PRAVASTATIN SODIUM 40 MG PO TABS: 40.0000 mg | ORAL_TABLET | Freq: Every day | ORAL | 1 refills | Status: DC

## 2022-04-19 NOTE — Patient Instructions (Signed)

## 2022-04-19 NOTE — Progress Notes (Signed)
Date:  04/19/2022   Name:  Alejandro Winters   DOB:  08/28/1960   MRN:  [id number]   Chief Complaint: Hypertension, Hyperlipidemia, Depression, Erectile Dysfunction, and Flu Vaccine  Hypertension This is a chronic problem. The current episode started more than 1 year ago. The problem has been gradually improving since onset. The problem is controlled. Pertinent negatives include no blurred vision, chest pain, headaches, orthopnea, palpitations, PND or shortness of breath. There are no associated agents to hypertension. Risk factors for coronary artery disease include dyslipidemia. Past treatments include ACE inhibitors and diuretics. There are no compliance problems.  There is no history of angina, kidney disease, CAD/MI, CVA, heart failure, left ventricular hypertrophy, PVD or retinopathy. There is no history of chronic renal disease, a hypertension causing med or renovascular disease.  Hyperlipidemia This is a chronic problem. The current episode started more than 1 year ago. The problem is controlled. Recent lipid tests were reviewed and are normal. He has no history of chronic renal disease or diabetes. There are no known factors aggravating his hyperlipidemia. Pertinent negatives include no chest pain, myalgias or shortness of breath. Current antihyperlipidemic treatment includes statins and fibric acid derivatives. The current treatment provides moderate improvement of lipids. There are no compliance problems.   Depression        This is a chronic problem.  The current episode started more than 1 year ago.   The onset quality is gradual.   The problem has been gradually improving since onset.  Associated symptoms include no decreased concentration, no fatigue, no helplessness, no hopelessness, does not have insomnia, not irritable, no restlessness, no decreased interest, no appetite change, no body aches, no myalgias, no headaches, no indigestion, not sad and no suicidal ideas.  Past treatments  include SSRIs - Selective serotonin reuptake inhibitors.  Previous treatment provided mild relief. Erectile Dysfunction This is a chronic problem. The problem has been gradually improving since onset. The nature of his difficulty is achieving erection. Irritative symptoms do not include frequency. Pertinent negatives include no chills or dysuria.    Lab Results  Component Value Date   NA 142 10/16/2021   K 3.8 10/16/2021   CO2 25 10/16/2021   GLUCOSE 101 (H) 10/16/2021   BUN 15 10/16/2021   CREATININE 1.44 (H) 10/16/2021   CALCIUM 10.2 10/16/2021   EGFR 55 (L) 10/16/2021   GFRNONAA >60 04/28/2021   Lab Results  Component Value Date   CHOL 182 10/16/2021   HDL 31 (L) 10/16/2021   LDLCALC 106 (H) 10/16/2021   TRIG 262 (H) 10/16/2021   CHOLHDL 5.0 11/02/2018   Lab Results  Component Value Date   TSH 2.05 08/15/2020   Lab Results  Component Value Date   HGBA1C 4.7 08/15/2020   Lab Results  Component Value Date   WBC 10.0 04/28/2021   HGB 11.3 (L) 04/28/2021   HCT 33.6 (L) 04/28/2021   MCV 96.8 04/28/2021   PLT 204 04/28/2021   Lab Results  Component Value Date   ALT 13 08/15/2020   AST 13 (A) 08/15/2020   ALKPHOS 59 08/15/2020   Lab Results  Component Value Date   VD25OH 30 08/15/2020     Review of Systems  Constitutional:  Negative for appetite change, chills and fatigue.  HENT:  Negative for congestion.   Eyes:  Negative for blurred vision and visual disturbance.  Respiratory:  Negative for cough, shortness of breath and wheezing.   Cardiovascular:  Negative for chest pain, palpitations, orthopnea  and PND.  Endocrine: Negative for polydipsia and polyuria.  Genitourinary:  Negative for dysuria and frequency.  Musculoskeletal:  Negative for myalgias.  Neurological:  Negative for headaches.  Psychiatric/Behavioral:  Positive for depression. Negative for decreased concentration and suicidal ideas. The patient does not have insomnia.     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[place]) 01/13/2021   Elevated PSA 01/13/2021   Mixed hyperlipidemia 08/22/2017   Essential hypertension 09/24/2016   CKD (chronic kidney disease) stage 3, GFR 30-59 ml/min (HCC) 09/24/2016    Allergies  Allergen Reactions   Tamsulosin Hcl     Causes dizziness    Past Surgical History:  Procedure Laterality Date   COLONOSCOPY WITH PROPOFOL N/A 12/16/2014   Procedure: COLONOSCOPY WITH PROPOFOL;  Surgeon: Josefine Class, MD;  Location: [facility] ENDOSCOPY;  Service: Endoscopy;  Laterality: N/A;   PELVIC LYMPH NODE DISSECTION Bilateral 04/27/2021   Procedure: PELVIC LYMPH NODE DISSECTION;  Surgeon: Hollice Espy, MD;  Location: [facility] ORS;  Service: Urology;  Laterality: Bilateral;   PROSTATE BIOPSY     ROBOT ASSISTED LAPAROSCOPIC RADICAL PROSTATECTOMY N/A 04/27/2021   Procedure: XI ROBOTIC ASSISTED LAPAROSCOPIC RADICAL PROSTATECTOMY;  Surgeon: Hollice Espy, MD;  Location: [facility] ORS;  Service: Urology;  Laterality: N/A;    Social History   Tobacco Use   Smoking status: Former    Packs/day: 0.50    Years: 1.00    Total pack years: 0.50    Types: Cigarettes    Quit date: 10/28/1998    Years since quitting: 23.4    Passive exposure: Never   Smokeless tobacco: Never  Vaping Use   Vaping Use: Never used  Substance Use Topics   Alcohol use: Yes    Alcohol/week: 0.0 standard drinks of alcohol   Drug use: Yes    Types: Marijuana    Comment: marijuana     Medication list has been reviewed and updated.  Current Meds  Medication Sig   Apple Cid Vn-Grn Tea-Bit Or-Cr (APPLE CIDER VINEGAR PLUS PO) Take 2 tablets by mouth daily. otc   fenofibrate (TRICOR) 145 MG tablet Take 1 tablet (145 mg total) by mouth daily.   hydrochlorothiazide (HYDRODIURIL) 25 MG tablet Take 1 tablet (25 mg total) by mouth daily.   lisinopril (ZESTRIL) 20 MG tablet One a day   Multiple  Vitamin (ONE-A-DAY MENS PO) Take 1 tablet by mouth daily.   pravastatin (PRAVACHOL) 40 MG tablet Take 1 tablet (40 mg total) by mouth daily.   sertraline (ZOLOFT) 50 MG tablet TAKE 1 TABLET BY MOUTH DAILY   tadalafil (CIALIS) 5 MG tablet Take 1 tablet (5 mg total) by mouth daily as needed for erectile dysfunction.       04/19/2022   10:15 AM 10/16/2021   10:53 AM 01/13/2021   10:04 AM 09/18/2020   11:30 AM  GAD 7 : Generalized Anxiety Score  Nervous, Anxious, on Edge 0 0 1 0  Control/stop worrying 0 0 1 0  Worry too much - different things 0 0 1 0  Trouble relaxing 0 0 0 0  Restless 0 0 0 0  Easily annoyed or irritable 0 0 1 0  Afraid - awful might happen 0 0 1 0  Total GAD 7 Score 0 0 5 0  Anxiety Difficulty Not difficult at all Not difficult at all Not difficult at all        04/19/2022  10:14 AM 10/16/2021   10:52 AM 01/13/2021   10:04 AM  Depression screen PHQ 2/9  Decreased Interest 0 0 0  Down, Depressed, Hopeless 0 1 1  PHQ - 2 Score 0 1 1  Altered sleeping 0 1 0  Tired, decreased energy 0 3 3  Change in appetite 0 0 1  Feeling bad or failure about yourself  0 0 2  Trouble concentrating 0 0 0  Moving slowly or fidgety/restless 0 0 0  Suicidal thoughts 0 0 1  PHQ-9 Score 0 5 8  Difficult doing work/chores Not difficult at all Somewhat difficult Not difficult at all    BP Readings from Last 3 Encounters:  04/19/22 128/78  10/16/21 130/80  06/26/21 137/83    Physical Exam Vitals and nursing note reviewed.  Constitutional:      General: He is not irritable. HENT:     Head: Normocephalic.     Right Ear: External ear normal.     Left Ear: External ear normal.     Nose: Nose normal.     Mouth/Throat:     Mouth: Mucous membranes are moist.  Eyes:     General: No scleral icterus.       Right eye: No discharge.        Left eye: No discharge.     Conjunctiva/sclera: Conjunctivae normal.     Pupils: Pupils are equal, round, and reactive to light.  Neck:      Thyroid: No thyromegaly.     Vascular: No JVD.     Trachea: No tracheal deviation.  Cardiovascular:     Rate and Rhythm: Normal rate and regular rhythm.     Heart sounds: Normal heart sounds. No murmur heard.    No friction rub. No gallop.  Pulmonary:     Effort: No respiratory distress.     Breath sounds: Normal breath sounds. No wheezing, rhonchi or rales.  Abdominal:     General: Bowel sounds are normal.     Palpations: Abdomen is soft. There is no mass.     Tenderness: There is no abdominal tenderness. There is no guarding or rebound.  Musculoskeletal:        General: No tenderness. Normal range of motion.     Cervical back: Normal range of motion and neck supple.  Lymphadenopathy:     Cervical: No cervical adenopathy.  Skin:    General: Skin is warm.     Findings: No rash.  Neurological:     Mental Status: He is alert and oriented to person, place, and time.     Cranial Nerves: No cranial nerve deficit.     Deep Tendon Reflexes: Reflexes are normal and symmetric.     Wt Readings from Last 3 Encounters:  04/19/22 (!) 303 lb (137.4 kg)  10/16/21 294 lb (133.4 kg)  06/26/21 283 lb (128.4 kg)    BP 128/78   Pulse 68   Ht 6' (1.829 m)   Wt (!) 303 lb (137.4 kg)   SpO2 98%   BMI 41.09 kg/m   Assessment and Plan:  1. Essential hypertension Chronic.  Controlled.  Stable.  Continue hydrochlorothiazide 25 mg once a day and lisinopril 20 mg once a day.  Will check CMP for electrolytes and GFR. - hydrochlorothiazide (HYDRODIURIL) 25 MG tablet; Take 1 tablet (25 mg total) by mouth daily.  Dispense: 90 tablet; Refill: 1 - lisinopril (ZESTRIL) 20 MG tablet; One a day  Dispense: 90 tablet; Refill: 1 - Comprehensive Metabolic  Panel (CMET)  2. Mixed hyperlipidemia Chronic.  Controlled.  Stable.  Continue fenofibrate 145 mg once a day and pravastatin 40 mg once a day.  Will check lipid panel for current level of control. - fenofibrate (TRICOR) 145 MG tablet; Take 1 tablet (145  mg total) by mouth daily.  Dispense: 90 tablet; Refill: 1 - pravastatin (PRAVACHOL) 40 MG tablet; Take 1 tablet (40 mg total) by mouth daily.  Dispense: 90 tablet; Refill: 1 - Lipid Panel With LDL/HDL Ratio - Comprehensive Metabolic Panel (CMET)  3. Chronic diastolic congestive heart failure ([place]) .  Controlled.  Stable.  No issues of shortness of breath dyspnea on exertion except for when climbing her stairs at a rapid pace and no orthopnea - hydrochlorothiazide (HYDRODIURIL) 25 MG tablet; Take 1 tablet (25 mg total) by mouth daily.  Dispense: 90 tablet; Refill: 1 - lisinopril (ZESTRIL) 20 MG tablet; One a day  Dispense: 90 tablet; Refill: 1  4. Major depressive disorder in partial remission, unspecified whether recurrent (HCC) Chronic.  Controlled.  Stable.  PHQ is 0 GAD score 0 continue sertraline 50 mg daily. - sertraline (ZOLOFT) 50 MG tablet; TAKE 1 TABLET BY MOUTH DAILY  Dispense: 90 tablet; Refill: 1  5. Erectile dysfunction, unspecified erectile dysfunction type Chronic.  Controlled on Cialis on as-needed basis and this will be continued as well. - tadalafil (CIALIS) 5 MG tablet; Take 1 tablet (5 mg total) by mouth daily as needed for erectile dysfunction.  Dispense: 30 tablet; Refill: 11  6. Need for immunization against influenza Discussed and administered - Flu Vaccine QUAD 35moIM (Fluarix, Fluzone & Alfiuria Quad PF)    DOtilio Miu MD

## 2022-04-29 ENCOUNTER — Other Ambulatory Visit
Admission: RE | Admit: 2022-04-29 | Discharge: 2022-04-29 | Disposition: A | Discharge status: Home / Self Care | Payer: 59 | Attending: Family Medicine | Admitting: Family Medicine

## 2022-04-29 DIAGNOSIS — E785 Hyperlipidemia, unspecified: Secondary | ICD-10-CM | POA: Insufficient documentation

## 2022-04-29 DIAGNOSIS — I10 Essential (primary) hypertension: Secondary | ICD-10-CM | POA: Insufficient documentation

## 2022-04-29 LAB — LIPID PANEL
Cholesterol: 153 mg/dL (ref 0–200)
HDL: 30 mg/dL — ABNORMAL LOW (ref >40)
LDL Cholesterol: 88 mg/dL (ref 0–99)
Total CHOL/HDL Ratio: 5.1 RATIO
Triglycerides: 175 mg/dL — ABNORMAL HIGH (ref <150)
VLDL: 35 mg/dL (ref 0–40)

## 2022-04-29 LAB — COMPREHENSIVE METABOLIC PANEL
ALT: 24 U/L (ref 0–44)
AST: 22 U/L (ref 15–41)
Albumin: 4.3 g/dL (ref 3.5–5.0)
Alkaline Phosphatase: 31 U/L — ABNORMAL LOW (ref 38–126)
Anion gap: 9 (ref 5–15)
BUN: 22 mg/dL (ref 8–23)
CO2: 26 mmol/L (ref 22–32)
Calcium: 9 mg/dL (ref 8.9–10.3)
Chloride: 105 mmol/L (ref 98–111)
Creatinine, Ser: 1.74 mg/dL — ABNORMAL HIGH (ref 0.61–1.24)
GFR, Estimated: 44 mL/min — ABNORMAL LOW (ref >60)
Glucose, Bld: 131 mg/dL — ABNORMAL HIGH (ref 70–99)
Potassium: 3.7 mmol/L (ref 3.5–5.1)
Sodium: 140 mmol/L (ref 135–145)
Total Bilirubin: 0.6 mg/dL (ref 0.3–1.2)
Total Protein: 7.3 g/dL (ref 6.5–8.1)

## 2022-05-10 DIAGNOSIS — Z83719 Family history of colon polyps, unspecified: Secondary | ICD-10-CM | POA: Diagnosis not present

## 2022-05-10 DIAGNOSIS — K5730 Diverticulosis of large intestine without perforation or abscess without bleeding: Secondary | ICD-10-CM | POA: Diagnosis not present

## 2022-05-19 DIAGNOSIS — G4733 Obstructive sleep apnea (adult) (pediatric): Secondary | ICD-10-CM | POA: Diagnosis not present

## 2022-06-19 DIAGNOSIS — G4733 Obstructive sleep apnea (adult) (pediatric): Secondary | ICD-10-CM | POA: Diagnosis not present

## 2022-06-28 ENCOUNTER — Other Ambulatory Visit: Payer: Self-pay

## 2022-06-28 ENCOUNTER — Other Ambulatory Visit: Payer: 59

## 2022-06-28 DIAGNOSIS — C61 Malignant neoplasm of prostate: Secondary | ICD-10-CM | POA: Diagnosis not present

## 2022-06-30 LAB — PSA: Prostate Specific Ag, Serum: <0.1 ng/mL (ref 0.0–4.0)

## 2022-07-02 ENCOUNTER — Ambulatory Visit: Payer: 59 | Admitting: Urology

## 2022-07-09 ENCOUNTER — Ambulatory Visit: Payer: 59 | Admitting: Urology

## 2022-07-19 DIAGNOSIS — G4733 Obstructive sleep apnea (adult) (pediatric): Secondary | ICD-10-CM | POA: Diagnosis not present

## 2022-07-30 ENCOUNTER — Encounter: Payer: Self-pay | Admitting: Urology

## 2022-07-30 ENCOUNTER — Ambulatory Visit: Payer: 59 | Admitting: Urology

## 2022-07-30 VITALS — BP 164/84 | HR 68 | Ht 72.0 in | Wt 301.0 lb

## 2022-07-30 DIAGNOSIS — Z8546 Personal history of malignant neoplasm of prostate: Secondary | ICD-10-CM | POA: Diagnosis not present

## 2022-07-30 DIAGNOSIS — R972 Elevated prostate specific antigen [PSA]: Secondary | ICD-10-CM | POA: Diagnosis not present

## 2022-07-30 DIAGNOSIS — N521 Erectile dysfunction due to diseases classified elsewhere: Secondary | ICD-10-CM

## 2022-07-30 DIAGNOSIS — C61 Malignant neoplasm of prostate: Secondary | ICD-10-CM

## 2022-07-30 MED ORDER — TADALAFIL 20 MG PO TABS: 20.0000 mg | ORAL_TABLET | Freq: Every day | ORAL | 11 refills | Status: AC | PRN

## 2022-07-30 NOTE — Progress Notes (Signed)
Marcelle Overlie Plume,acting as a scribe for Vanna Scotland, MD.,have documented all relevant documentation on the behalf of Vanna Scotland, MD,as directed by  Vanna Scotland, MD while in the presence of Vanna Scotland, MD.  07/30/2022 1:48 PM   Alejandro Winters [birth year] [id number]  Referring provider: Duanne Limerick, MD [address] [place],  [place] [address]  Chief Complaint  Patient presents with   Prostate Cancer    HPI: 62 year-old male with a personal history of unfavorable intermediate risk prostate cancer. He presents today for annual follow up.   He underwent a prostate biopsy on 03/11/2021. Surgical pathology revealed Gleason 3+3 involving 3 cores affecting up to 80%, Gleason 3+4 involving 6 cores affecting up to 100%, Gleason 4+3 involving 1 core affecting up to 50%. TRUS volume was 48gm.     He is s/p DaVinci laparoscopic radical prostatectomy and laparoscopic bilateral pelvic lymph node dissection on 04/27/2021. Surgical pathology was consistent with  fat pad and lymph nodes negative for malignancy. 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 With extraprostatic extension and right seminal vesicle invasion.    His most recent PSA was undetectable.    He was previously experiencing significant urinary incontinence and erectile dysfunction post-surgery. He reports that he no longer experiences urinary leakage, even with activities such as sneezing or bending down, and does not require pads or diapers. He does, however, experience occasional urgency. His urinary stream is strong, and he feels he is emptying his bladder completely. Despite taking a low dose of Cialis daily, he has not achieved any natural erections. He is interested in exploring more aggressive treatments for erectile dysfunction.   PMH: Past Medical History:  Diagnosis Date   Cancer (HCC)    CHF (congestive heart failure) (HCC)    Depression    Hyperlipemia     Hypertension    Kidney failure    Sleep apnea    Vitiligo     Surgical History: Past Surgical History:  Procedure Laterality Date   COLONOSCOPY WITH PROPOFOL N/A 12/16/2014   Procedure: COLONOSCOPY WITH PROPOFOL;  Surgeon: Elnita Maxwell, MD;  Location[facility];  Service: Endoscopy;  Laterality: N/A;   PELVIC LYMPH NODE DISSECTION Bilateral 04/27/2021   Procedure: PELVIC LYMPH NODE DISSECTION;  Surgeon: Vanna Scotland, MD;  Location: [facility] ORS;  Service: Urology;  Laterality: Bilateral;   PROSTATE BIOPSY     ROBOT ASSISTED LAPAROSCOPIC RADICAL PROSTATECTOMY N/A 04/27/2021   Procedure: XI ROBOTIC ASSISTED LAPAROSCOPIC RADICAL PROSTATECTOMY;  Surgeon: Vanna Scotland, MD;  Location: [facility] ORS;  Service: Urology;  Laterality: N/A;    Home Medications:  Allergies as of 07/30/2022       Reactions   Tamsulosin Hcl    Causes dizziness        Medication List        Accurate as of Jul 30, 2022  1:48 PM. If you have any questions, ask your nurse or doctor.          STOP taking these medications    APPLE CIDER VINEGAR PLUS PO Stopped by: Vanna Scotland, MD       TAKE these medications    fenofibrate 145 MG tablet Commonly known as: Tricor Take 1 tablet (145 mg total) by mouth daily.   hydrochlorothiazide 25 MG tablet Commonly known as: HYDRODIURIL Take 1 tablet (25 mg total) by mouth daily.   lisinopril 20 MG tablet Commonly known as: ZESTRIL One a day   ONE-A-DAY MENS PO Take  1 tablet by mouth daily.   pravastatin 40 MG tablet Commonly known as: PRAVACHOL Take 1 tablet (40 mg total) by mouth daily.   sertraline 50 MG tablet Commonly known as: ZOLOFT TAKE 1 TABLET BY MOUTH DAILY   tadalafil 20 MG tablet Commonly known as: CIALIS Take 1 tablet (20 mg total) by mouth daily as needed for erectile dysfunction. What changed:  medication strength how much to take Changed by: Vanna Scotland, MD        Allergies:  Allergies  Allergen Reactions    Tamsulosin Hcl     Causes dizziness    Family History: Family History  Problem Relation Age of Onset   Hypertension Mother    Cancer Father    Hypertension Father    Prostate cancer Neg Hx    Bladder Cancer Neg Hx    Kidney cancer Neg Hx     Social History:  reports that he quit smoking about 23 years ago. His smoking use included cigarettes. He has a 0.50 pack-year smoking history. He has never been exposed to tobacco smoke. He has never used smokeless tobacco. He reports current alcohol use. He reports current drug use. Drug: Marijuana.   Physical Exam: BP (!) 164/84 (BP Location: Left Arm, Patient Position: Sitting, Cuff Size: Large)   Pulse 68   Ht 6' (1.829 m)   Wt (!) 301 lb (136.5 kg)   BMI 40.82 kg/m   Constitutional:  Alert and oriented, No acute distress. HEENT: [place] AT, moist mucus membranes.  Trachea midline, no masses. Neurologic: Grossly intact, no focal deficits, moving all 4 extremities. Psychiatric: Normal mood and affect.  Assessment & Plan:    1. Prostate cancer - PSA remains undetectable, indicating no recurrence of disease - Emphasized the importance of regular follow-up appointments and PSA checks every six months due to higher risk of recurrence. - Orders placed for PSA test in six months.  2. Erectile dysfunction - Current low-dose daily Cialis has not been effective. - Plan to try a higher dose of Cialis (20 mg, four tablets at once) and assess response. - If ineffective, consider switching to generic Viagra. - Discussed potential for penile injections or a penile implant if oral medications are ineffective. - He will send a MyChart message with updates on the effectiveness of the higher dose of Cialis. - Discussed that low testosterone is unlikely given his symptoms and history. Explained that testosterone supplementation is contraindicated due to the history of prostate cancer  Return in about 6 months (around 01/29/2023) for PSA, 1 yr follow  up .  I have reviewed the above documentation for accuracy and completeness, and I agree with the above.   Vanna Scotland, MD   [facility] [address], [address] [place], [place] [address] [phone]

## 2022-08-10 ENCOUNTER — Encounter: Payer: Self-pay | Admitting: Gastroenterology

## 2022-08-15 NOTE — H&P (Signed)
Pre-Procedure H&P   Patient ID: Alejandro Winters is a 62 y.o. male.  Gastroenterology Provider: Jaynie Collins, DO  Referring Provider: Jacob Moores, PA PCP: Alejandro Limerick, MD  Date: 08/16/2022  HPI Alejandro Winters is a 62 y.o. male who presents today for Colonoscopy for Colorectal cancer screening;  Previous colonoscopy 2016 demonstrating left-sided diverticulosis, internal hemorrhoids and otherwise negative.  No family history of colon cancer  1-2 bowel movements daily.  No melena or hematochezia.   Past Medical History:  Diagnosis Date   Cancer Alejandro Winters)    prostate   CHF (congestive heart failure) (HCC)    Depression    Hyperlipemia    Hypertension    Kidney failure    Sleep apnea    Vitiligo     Past Surgical History:  Procedure Laterality Date   COLONOSCOPY WITH PROPOFOL N/A 12/16/2014   Procedure: COLONOSCOPY WITH PROPOFOL;  Surgeon: Alejandro Maxwell, MD;  Location: [facility] ENDOSCOPY;  Service: Endoscopy;  Laterality: N/A;   PELVIC LYMPH NODE DISSECTION Bilateral 04/27/2021   Procedure: PELVIC LYMPH NODE DISSECTION;  Surgeon: Alejandro Scotland, MD;  Location: [facility] ORS;  Service: Urology;  Laterality: Bilateral;   PROSTATE BIOPSY     ROBOT ASSISTED LAPAROSCOPIC RADICAL PROSTATECTOMY N/A 04/27/2021   Procedure: XI ROBOTIC ASSISTED LAPAROSCOPIC RADICAL PROSTATECTOMY;  Surgeon: Alejandro Scotland, MD;  Location: [facility] ORS;  Service: Urology;  Laterality: N/A;    Family History No h/o GI disease or malignancy  Review of Systems  Constitutional:  Negative for activity change, appetite change, chills, diaphoresis, fatigue, fever and unexpected weight change.  HENT:  Negative for trouble swallowing and voice change.   Respiratory:  Negative for shortness of breath and wheezing.   Cardiovascular:  Negative for chest pain, palpitations and leg swelling.  Gastrointestinal:  Negative for abdominal distention, abdominal pain, anal bleeding, blood in stool, constipation,  diarrhea, nausea and vomiting.  Musculoskeletal:  Negative for arthralgias and myalgias.  Skin:  Negative for color change and pallor.  Neurological:  Negative for dizziness, syncope and weakness.  Psychiatric/Behavioral:  Negative for confusion. The patient is not nervous/anxious.   All other systems reviewed and are negative.    Medications No current facility-administered medications on file prior to encounter.   Current Outpatient Medications on File Prior to Encounter  Medication Sig Dispense Refill   co-enzyme Q-10 30 MG capsule Take 30 mg by mouth 3 (three) times daily.     KRILL OIL PO Take 1,250 mg by mouth.     fenofibrate (TRICOR) 145 MG tablet Take 1 tablet (145 mg total) by mouth daily. 90 tablet 1   hydrochlorothiazide (HYDRODIURIL) 25 MG tablet Take 1 tablet (25 mg total) by mouth daily. 90 tablet 1   lisinopril (ZESTRIL) 20 MG tablet One a day 90 tablet 1   Multiple Vitamin (ONE-A-DAY MENS PO) Take 1 tablet by mouth daily.     pravastatin (PRAVACHOL) 40 MG tablet Take 1 tablet (40 mg total) by mouth daily. 90 tablet 1   sertraline (ZOLOFT) 50 MG tablet TAKE 1 TABLET BY MOUTH DAILY 90 tablet 1    Pertinent medications related to GI and procedure were reviewed by me with the patient prior to the procedure   Current Facility-Administered Medications:    0.9 %  sodium chloride infusion, , Intravenous, Continuous, Alejandro Collins, DO, Last Rate: 20 mL/hr at 08/16/22 0918, New Bag at 08/16/22 0918  sodium chloride 20 mL/hr at 08/16/22 1610       Allergies  Allergen Reactions   Tamsulosin Hcl     Causes dizziness   Allergies were reviewed by me prior to the procedure  Objective   Body mass index is 40.17 kg/m. Vitals:   08/16/22 0855  BP: (!) 159/85  Pulse: 73  Resp: 18  Temp: (!) 96.9 F (36.1 C)  TempSrc: Temporal  SpO2: 98%  Weight: 134.4 kg  Height: 6' (1.829 m)     Physical Exam Vitals and nursing note reviewed.  Constitutional:       General: He is not in acute distress.    Appearance: Normal appearance. He is obese. He is not ill-appearing, toxic-appearing or diaphoretic.  HENT:     Head: Normocephalic and atraumatic.     Nose: Nose normal.     Mouth/Throat:     Mouth: Mucous membranes are moist.     Pharynx: Oropharynx is clear.  Eyes:     General: No scleral icterus.    Extraocular Movements: Extraocular movements intact.  Cardiovascular:     Rate and Rhythm: Normal rate and regular rhythm.     Heart sounds: Normal heart sounds. No murmur heard.    No friction rub. No gallop.  Pulmonary:     Effort: Pulmonary effort is normal. No respiratory distress.     Breath sounds: Normal breath sounds. No wheezing, rhonchi or rales.  Abdominal:     General: Bowel sounds are normal. There is no distension.     Palpations: Abdomen is soft.     Tenderness: There is no abdominal tenderness. There is no guarding or rebound.  Musculoskeletal:     Cervical back: Neck supple.     Right lower leg: No edema.     Left lower leg: No edema.  Skin:    General: Skin is warm and dry.     Coloration: Skin is not jaundiced or pale.  Neurological:     General: No focal deficit present.     Mental Status: He is alert and oriented to person, place, and time. Mental status is at baseline.  Psychiatric:        Mood and Affect: Mood normal.        Behavior: Behavior normal.        Thought Content: Thought content normal.        Judgment: Judgment normal.      Assessment:  Mr. Alejandro Winters is a 62 y.o. male  who presents today for Colonoscopy for Colorectal cancer screening; family history of colon polyps .  Plan:  Colonoscopy with possible intervention today  Colonoscopy with possible biopsy, control of bleeding, polypectomy, and interventions as necessary has been discussed with the patient/patient representative. Informed consent was obtained from the patient/patient representative after explaining the indication, nature, and  risks of the procedure including but not limited to death, bleeding, perforation, missed neoplasm/lesions, cardiorespiratory compromise, and reaction to medications. Opportunity for questions was given and appropriate answers were provided. Patient/patient representative has verbalized understanding is amenable to undergoing the procedure.   Alejandro Collins, DO  [facility]  Portions of the record may have been created with voice recognition software. Occasional wrong-word or 'sound-a-like' substitutions may have occurred due to the inherent limitations of voice recognition software.  Read the chart carefully and recognize, using context, where substitutions may have occurred.

## 2022-08-16 ENCOUNTER — Ambulatory Visit: Payer: 59 | Admitting: Anesthesiology

## 2022-08-16 ENCOUNTER — Other Ambulatory Visit: Payer: Self-pay

## 2022-08-16 ENCOUNTER — Ambulatory Visit
Admission: RE | Admit: 2022-08-16 | Discharge: 2022-08-16 | Disposition: A | Discharge status: Home / Self Care | Payer: 59 | Attending: Gastroenterology | Admitting: Gastroenterology

## 2022-08-16 ENCOUNTER — Encounter: Payer: Self-pay | Admitting: Gastroenterology

## 2022-08-16 ENCOUNTER — Encounter
Admission: RE | Disposition: A | Discharge status: Home / Self Care | Payer: Self-pay | Source: Home / Self Care | Attending: Gastroenterology

## 2022-08-16 DIAGNOSIS — Z87891 Personal history of nicotine dependence: Secondary | ICD-10-CM | POA: Diagnosis not present

## 2022-08-16 DIAGNOSIS — D631 Anemia in chronic kidney disease: Secondary | ICD-10-CM | POA: Diagnosis not present

## 2022-08-16 DIAGNOSIS — Z83719 Family history of colon polyps, unspecified: Secondary | ICD-10-CM | POA: Diagnosis not present

## 2022-08-16 DIAGNOSIS — I130 Hypertensive heart and chronic kidney disease with heart failure and stage 1 through stage 4 chronic kidney disease, or unspecified chronic kidney disease: Secondary | ICD-10-CM | POA: Diagnosis not present

## 2022-08-16 DIAGNOSIS — K5730 Diverticulosis of large intestine without perforation or abscess without bleeding: Secondary | ICD-10-CM | POA: Insufficient documentation

## 2022-08-16 DIAGNOSIS — I509 Heart failure, unspecified: Secondary | ICD-10-CM | POA: Insufficient documentation

## 2022-08-16 DIAGNOSIS — D120 Benign neoplasm of cecum: Secondary | ICD-10-CM | POA: Diagnosis not present

## 2022-08-16 DIAGNOSIS — D124 Benign neoplasm of descending colon: Secondary | ICD-10-CM | POA: Diagnosis not present

## 2022-08-16 DIAGNOSIS — K640 First degree hemorrhoids: Secondary | ICD-10-CM | POA: Diagnosis not present

## 2022-08-16 DIAGNOSIS — Z1211 Encounter for screening for malignant neoplasm of colon: Secondary | ICD-10-CM | POA: Insufficient documentation

## 2022-08-16 DIAGNOSIS — I110 Hypertensive heart disease with heart failure: Secondary | ICD-10-CM | POA: Diagnosis not present

## 2022-08-16 DIAGNOSIS — K635 Polyp of colon: Secondary | ICD-10-CM | POA: Diagnosis not present

## 2022-08-16 DIAGNOSIS — N183 Chronic kidney disease, stage 3 unspecified: Secondary | ICD-10-CM | POA: Diagnosis not present

## 2022-08-16 HISTORY — PX: COLONOSCOPY: SHX5424

## 2022-08-16 HISTORY — PX: POLYPECTOMY: SHX5525

## 2022-08-16 SURGERY — COLONOSCOPY
Anesthesia: General

## 2022-08-16 MED ORDER — SODIUM CHLORIDE 0.9 % IV SOLN: INTRAVENOUS | Status: DC

## 2022-08-16 MED ORDER — EPHEDRINE 5 MG/ML INJ
INTRAVENOUS | Status: AC
  Filled 2022-08-16: qty 5

## 2022-08-16 MED ORDER — PROPOFOL 1000 MG/100ML IV EMUL
INTRAVENOUS | Status: AC
  Filled 2022-08-16: qty 100

## 2022-08-16 MED ORDER — PROPOFOL 500 MG/50ML IV EMUL
INTRAVENOUS | Status: DC | PRN
  Administered 2022-08-16: 182.292 ug/kg/min via INTRAVENOUS

## 2022-08-16 MED ORDER — LIDOCAINE HCL (CARDIAC) PF 100 MG/5ML IV SOSY
PREFILLED_SYRINGE | INTRAVENOUS | Status: DC | PRN
  Administered 2022-08-16: 100 mg via INTRAVENOUS

## 2022-08-16 MED ORDER — PROPOFOL 10 MG/ML IV BOLUS
INTRAVENOUS | Status: DC | PRN
  Administered 2022-08-16: 130 mg via INTRAVENOUS

## 2022-08-16 MED ORDER — PROPOFOL 1000 MG/100ML IV EMUL
INTRAVENOUS | Status: AC
  Filled 2022-08-16: qty 200

## 2022-08-16 NOTE — Op Note (Addendum)
[facility] Gastroenterology Patient Name: Alejandro Winters Procedure Date: 08/16/2022 9:23 AM MRN: [id number] Account #: [id number] Date of Birth: 10/09/1960 Admit Type: Outpatient Age: 62 Room: [facility] ENDO ROOM 2 Gender: Male Note Status: Supervisor Override Instrument Name: Prentice Docker [id number] Procedure:             Colonoscopy Indications:           Screening for colorectal malignant neoplasm, Colon                         cancer screening in patient at increased risk: Family                         history of 1st-degree relative with colon polyps Providers:             Trenda Moots, DO Referring MD:          Jaynie Collins DO, DO (Referring MD), Duanne Limerick, MD (Referring MD) Medicines:             Monitored Anesthesia Care Complications:         No immediate complications. Estimated blood loss:                         Minimal. Procedure:             Pre-Anesthesia Assessment:                        - Prior to the procedure, a History and Physical was                         performed, and patient medications and allergies were                         reviewed. The patient is competent. The risks and                         benefits of the procedure and the sedation options and                         risks were discussed with the patient. All questions                         were answered and informed consent was obtained.                         Patient identification and proposed procedure were                         verified by the physician, the nurse, the anesthetist                         and the technician in the endoscopy suite. Mental                         Status Examination: alert and oriented. Airway  Examination: normal oropharyngeal airway and neck                         mobility. Respiratory Examination: clear to                         auscultation. CV Examination: RRR, no  murmurs, no S3                         or S4. Prophylactic Antibiotics: The patient does not                         require prophylactic antibiotics. Prior                         Anticoagulants: The patient has taken no anticoagulant                         or antiplatelet agents. ASA Grade Assessment: III - A                         patient with severe systemic disease. After reviewing                         the risks and benefits, the patient was deemed in                         satisfactory condition to undergo the procedure. The                         anesthesia plan was to use monitored anesthesia care                         (MAC). Immediately prior to administration of                         medications, the patient was re-assessed for adequacy                         to receive sedatives. The heart rate, respiratory                         rate, oxygen saturations, blood pressure, adequacy of                         pulmonary ventilation, and response to care were                         monitored throughout the procedure. The physical                         status of the patient was re-assessed after the                         procedure.                        After obtaining informed consent, the colonoscope was  passed under direct vision. Throughout the procedure,                         the patient's blood pressure, pulse, and oxygen                         saturations were monitored continuously. The                         Colonoscope was introduced through the anus and                         advanced to the the cecum, identified by appendiceal                         orifice and ileocecal valve. The colonoscopy was                         performed without difficulty. The patient tolerated                         the procedure well. The quality of the bowel                         preparation was evaluated using the BBPS [facility] Bowel                          Preparation Scale) with scores of: Right Colon = 2                         (minor amount of residual staining, small fragments of                         stool and/or opaque liquid, but mucosa seen well),                         Transverse Colon = 3 (entire mucosa seen well with no                         residual staining, small fragments of stool or opaque                         liquid) and Left Colon = 2 (minor amount of residual                         staining, small fragments of stool and/or opaque                         liquid, but mucosa seen well). The total BBPS score                         equals 7. The quality of the bowel preparation was                         good. The ileocecal valve, appendiceal orifice, and  rectum were photographed. Findings:      The perianal and digital rectal examinations were normal. Pertinent       negatives include normal sphincter tone.      A 12 to 14 mm polyp was found in the cecum. The polyp was       semi-pedunculated. The polyp was removed with a hot snare. Resection and       retrieval were complete. Estimated blood loss was minimal.      Two sessile polyps were found in the descending colon and cecum. The       polyps were 1 to 2 mm in size. These polyps were removed with a jumbo       cold forceps. Resection and retrieval were complete. Estimated blood       loss was minimal.      Multiple small-mouthed diverticula were found in the entire colon.       Estimated blood loss: none.      Non-bleeding internal hemorrhoids were found during retroflexion. The       hemorrhoids were Grade I (internal hemorrhoids that do not prolapse).       Estimated blood loss: none.      The exam was otherwise without abnormality on direct and retroflexion       views. Impression:            - One 12 to 14 mm polyp in the cecum, removed with a                         hot snare. Resected and retrieved.                         - Two 1 to 2 mm polyps in the descending colon and in                         the cecum, removed with a jumbo cold forceps. Resected                         and retrieved.                        - Diverticulosis in the entire examined colon.                        - Non-bleeding internal hemorrhoids.                        - The examination was otherwise normal on direct and                         retroflexion views. Recommendation:        - Patient has a contact number available for                         emergencies. The signs and symptoms of potential                         delayed complications were discussed with the patient.                         Return to normal activities tomorrow. Written  discharge instructions were provided to the patient.                        - Discharge patient to home.                        - Resume previous diet.                        - Continue present medications.                        - Await pathology results.                        - Repeat colonoscopy for surveillance based on                         pathology results.                        - Return to referring physician as previously                         scheduled.                        - No ibuprofen, naproxen, or other non-steroidal                         anti-inflammatory drugs for 5 days after polyp removal.                        - The findings and recommendations were discussed with                         the patient. Procedure Code(s):     --- Professional ---                        [phone], Colonoscopy, flexible; with removal of                         tumor(s), polyp(s), or other lesion(s) by snare                         technique                        45380, 59, Colonoscopy, flexible; with biopsy, single                         or multiple Diagnosis Code(s):     --- Professional ---                        Z12.11, Encounter for screening for  malignant neoplasm                         of colon                        D12.0, Benign neoplasm of cecum  D12.4, Benign neoplasm of descending colon                        K64.0, First degree hemorrhoids                        K57.30, Diverticulosis of large intestine without                         perforation or abscess without bleeding CPT copyright 2022 American Medical Association. All rights reserved. The codes documented in this report are preliminary and upon coder review may  be revised to meet current compliance requirements. Attending Participation:      I personally performed the entire procedure. Elfredia Nevins, DO Jaynie Collins DO, DO 08/16/2022 10:20:24 AM This report has been signed electronically. Number of Addenda: 0 Note Initiated On: 08/16/2022 9:23 AM Scope Withdrawal Time: 0 hours 19 minutes 12 seconds  Total Procedure Duration: 0 hours 36 minutes 9 seconds  Estimated Blood Loss:  Estimated blood loss was minimal.      [facility]

## 2022-08-16 NOTE — Transfer of Care (Signed)
Immediate Anesthesia Transfer of Care Note  Patient: Alejandro Winters  Procedure(s) Performed: COLONOSCOPY  Patient Location: Endoscopy Unit  Anesthesia Type:General  Level of Consciousness: drowsy  Airway & Oxygen Therapy: Patient Spontanous Breathing and Patient connected to face mask oxygen  Post-op Assessment: Report given to RN and Post -op Vital signs reviewed and stable  Post vital signs: Reviewed and stable  Last Vitals: see EPIC Flowsheet data Vitals Value Taken Time  BP    Temp    Pulse    Resp    SpO2      Last Pain:  Vitals:   08/16/22 0855  TempSrc: Temporal  PainSc: 0-No pain         Complications: No notable events documented.

## 2022-08-16 NOTE — Anesthesia Postprocedure Evaluation (Signed)
Anesthesia Post Note  Patient: Alejandro Winters  Procedure(s) Performed: COLONOSCOPY  Patient location during evaluation: PACU Anesthesia Type: General Level of consciousness: awake and alert, oriented and patient cooperative Pain management: pain level controlled Vital Signs Assessment: post-procedure vital signs reviewed and stable Respiratory status: spontaneous breathing, nonlabored ventilation and respiratory function stable Cardiovascular status: blood pressure returned to baseline and stable Postop Assessment: adequate PO intake Anesthetic complications: no   No notable events documented.   Last Vitals:  Vitals:   08/16/22 1017 08/16/22 1045  BP: 111/60 126/73  Pulse: 87 74  Resp: (!) 29 14  Temp: (!) 36.1 C   SpO2: 93% 96%    Last Pain:  Vitals:   08/16/22 1045  TempSrc:   PainSc: 0-No pain                 Reed Breech

## 2022-08-16 NOTE — Interval H&P Note (Signed)
History and Physical Interval Note: Preprocedure H&P from 08/16/22  was reviewed and there was no interval change after seeing and examining the patient.  Written consent was obtained from the patient after discussion of risks, benefits, and alternatives. Patient has consented to proceed with Esophagogastroduodenoscopy and Colonoscopy with possible intervention   08/16/2022 9:26 AM  Alejandro Winters  has presented today for surgery, with the diagnosis of V18.51 (ICD-9-CM) - Z83.719 (ICD-10-CM) - Family history of polyps in the colon.  The various methods of treatment have been discussed with the patient and family. After consideration of risks, benefits and other options for treatment, the patient has consented to  Procedure(s): COLONOSCOPY (N/A) as a surgical intervention.  The patient's history has been reviewed, patient examined, no change in status, stable for surgery.  I have reviewed the patient's chart and labs.  Questions were answered to the patient's satisfaction.     Jaynie Collins

## 2022-08-16 NOTE — Anesthesia Preprocedure Evaluation (Addendum)
Anesthesia Evaluation  Patient identified by MRN, date of birth, ID band Patient awake    Reviewed: Allergy & Precautions, NPO status , Patient's Chart, lab work & pertinent test results  History of Anesthesia Complications Negative for: history of anesthetic complications  Airway Mallampati: IV   Neck ROM: Full    Dental  (+) Missing   Pulmonary sleep apnea and Continuous Positive Airway Pressure Ventilation , former smoker (quit 2000)   Pulmonary exam normal breath sounds clear to auscultation       Cardiovascular hypertension, +CHF (diastolic)  Normal cardiovascular exam Rhythm:Regular Rate:Normal     Neuro/Psych  PSYCHIATRIC DISORDERS  Depression    negative neurological ROS     GI/Hepatic negative GI ROS,,,  Endo/Other  Obesity   Renal/GU Renal disease (stage III CKD)   Prostate CA, BPH    Musculoskeletal   Abdominal   Peds  Hematology  (+) Blood dyscrasia, anemia   Anesthesia Other Findings   Reproductive/Obstetrics                             Anesthesia Physical Anesthesia Plan  ASA: 3  Anesthesia Plan: General   Post-op Pain Management:    Induction: Intravenous  PONV Risk Score and Plan: 2 and Propofol infusion, TIVA and Treatment may vary due to age or medical condition  Airway Management Planned: Natural Airway  Additional Equipment:   Intra-op Plan:   Post-operative Plan:   Informed Consent: I have reviewed the patients History and Physical, chart, labs and discussed the procedure including the risks, benefits and alternatives for the proposed anesthesia with the patient or authorized representative who has indicated his/her understanding and acceptance.       Plan Discussed with: CRNA  Anesthesia Plan Comments: (LMA/GETA backup discussed.  Patient consented for risks of anesthesia including but not limited to:  - adverse reactions to medications -  damage to eyes, teeth, lips or other oral mucosa - nerve damage due to positioning  - sore throat or hoarseness - damage to heart, brain, nerves, lungs, other parts of body or loss of life  Informed patient about role of CRNA in peri- and intra-operative care.  Patient voiced understanding.)        Anesthesia Quick Evaluation

## 2022-08-17 ENCOUNTER — Encounter: Payer: Self-pay | Admitting: Gastroenterology

## 2022-08-19 ENCOUNTER — Encounter: Payer: Self-pay | Admitting: Gastroenterology

## 2022-10-18 ENCOUNTER — Encounter: Payer: Self-pay | Admitting: Family Medicine

## 2022-10-18 ENCOUNTER — Ambulatory Visit (INDEPENDENT_AMBULATORY_CARE_PROVIDER_SITE_OTHER): Payer: 59 | Admitting: Family Medicine

## 2022-10-18 ENCOUNTER — Other Ambulatory Visit
Admission: RE | Admit: 2022-10-18 | Discharge: 2022-10-18 | Disposition: A | Discharge status: Home / Self Care | Payer: 59 | Attending: Family Medicine | Admitting: Family Medicine

## 2022-10-18 VITALS — BP 128/78 | HR 64 | Ht 72.0 in | Wt 293.0 lb

## 2022-10-18 DIAGNOSIS — Z23 Encounter for immunization: Secondary | ICD-10-CM | POA: Diagnosis not present

## 2022-10-18 DIAGNOSIS — I10 Essential (primary) hypertension: Secondary | ICD-10-CM | POA: Insufficient documentation

## 2022-10-18 DIAGNOSIS — E782 Mixed hyperlipidemia: Secondary | ICD-10-CM | POA: Diagnosis not present

## 2022-10-18 DIAGNOSIS — F324 Major depressive disorder, single episode, in partial remission: Secondary | ICD-10-CM | POA: Diagnosis not present

## 2022-10-18 DIAGNOSIS — I5032 Chronic diastolic (congestive) heart failure: Secondary | ICD-10-CM | POA: Diagnosis not present

## 2022-10-18 LAB — RENAL FUNCTION PANEL
Albumin: 4.6 g/dL (ref 3.5–5.0)
Anion gap: 9 (ref 5–15)
BUN: 20 mg/dL (ref 8–23)
CO2: 26 mmol/L (ref 22–32)
Calcium: 9.3 mg/dL (ref 8.9–10.3)
Chloride: 106 mmol/L (ref 98–111)
Creatinine, Ser: 1.67 mg/dL — ABNORMAL HIGH (ref 0.61–1.24)
GFR, Estimated: 46 mL/min — ABNORMAL LOW (ref >60)
Glucose, Bld: 107 mg/dL — ABNORMAL HIGH (ref 70–99)
Phosphorus: 2.9 mg/dL (ref 2.5–4.6)
Potassium: 3.6 mmol/L (ref 3.5–5.1)
Sodium: 141 mmol/L (ref 135–145)

## 2022-10-18 MED ORDER — FENOFIBRATE 145 MG PO TABS
145.0000 mg | ORAL_TABLET | Freq: Every day | ORAL | Status: DC
Start: 2022-10-18 — End: 2022-11-02

## 2022-10-18 MED ORDER — HYDROCHLOROTHIAZIDE 12.5 MG PO TABS: 12.5000 mg | ORAL_TABLET | Freq: Every day | ORAL | 1 refills | Status: DC

## 2022-10-18 MED ORDER — PRAVASTATIN SODIUM 40 MG PO TABS: 40.0000 mg | ORAL_TABLET | Freq: Every day | ORAL | Status: DC

## 2022-10-18 MED ORDER — LISINOPRIL 20 MG PO TABS: ORAL_TABLET | ORAL | Status: DC

## 2022-10-18 MED ORDER — SERTRALINE HCL 50 MG PO TABS: ORAL_TABLET | ORAL | Status: DC

## 2022-10-18 NOTE — Progress Notes (Signed)
Date:  10/18/2022   Name:  Alejandro Winters   DOB:  April 27, 1960   MRN:  [id number]   Chief Complaint: Depression, Hypertension, and Hyperlipidemia  Depression        This is a chronic problem.  The current episode started more than 1 year ago.   The onset quality is gradual.   The problem has been gradually improving since onset.  Associated symptoms include no decreased concentration, no fatigue, no helplessness, no hopelessness, does not have insomnia, not irritable, no restlessness, no decreased interest, no appetite change, no body aches, no myalgias, no headaches, no indigestion, not sad and no suicidal ideas.     The symptoms are aggravated by nothing.  Past treatments include SSRIs - Selective serotonin reuptake inhibitors.  Compliance with treatment is good.  Previous treatment provided moderate relief. Hypertension This is a chronic problem. The current episode started more than 1 year ago. The problem has been gradually improving since onset. The problem is controlled. Pertinent negatives include no blurred vision, chest pain, headaches, malaise/fatigue, orthopnea, palpitations, peripheral edema, PND, shortness of breath or sweats. There are no associated agents to hypertension. Past treatments include ACE inhibitors and diuretics. The current treatment provides moderate improvement. There are no compliance problems.  There is no history of angina or CAD/MI. There is no history of chronic renal disease, a hypertension causing med or renovascular disease.  Hyperlipidemia This is a chronic problem. The current episode started more than 1 year ago. The problem is controlled. Recent lipid tests were reviewed and are normal. He has no history of chronic renal disease. Pertinent negatives include no chest pain, focal sensory loss, myalgias or shortness of breath. Current antihyperlipidemic treatment includes statins and fibric acid derivatives. The current treatment provides moderate improvement of  lipids. There are no compliance problems.  Risk factors for coronary artery disease include dyslipidemia.    Lab Results  Component Value Date   NA 140 04/29/2022   K 3.7 04/29/2022   CO2 26 04/29/2022   GLUCOSE 131 (H) 04/29/2022   BUN 22 04/29/2022   CREATININE 1.74 (H) 04/29/2022   CALCIUM 9.0 04/29/2022   EGFR 55 (L) 10/16/2021   GFRNONAA 44 (L) 04/29/2022   Lab Results  Component Value Date   CHOL 153 04/29/2022   HDL 30 (L) 04/29/2022   LDLCALC 88 04/29/2022   TRIG 175 (H) 04/29/2022   CHOLHDL 5.1 04/29/2022   Lab Results  Component Value Date   TSH 2.05 08/15/2020   Lab Results  Component Value Date   HGBA1C 4.7 08/15/2020   Lab Results  Component Value Date   WBC 10.0 04/28/2021   HGB 11.3 (L) 04/28/2021   HCT 33.6 (L) 04/28/2021   MCV 96.8 04/28/2021   PLT 204 04/28/2021   Lab Results  Component Value Date   ALT 24 04/29/2022   AST 22 04/29/2022   ALKPHOS 31 (L) 04/29/2022   BILITOT 0.6 04/29/2022   Lab Results  Component Value Date   VD25OH 30 08/15/2020     Review of Systems  Constitutional:  Negative for appetite change, fatigue and malaise/fatigue.  Eyes:  Negative for blurred vision.  Respiratory:  Negative for chest tightness, shortness of breath and wheezing.   Cardiovascular:  Negative for chest pain, palpitations, orthopnea and PND.  Musculoskeletal:  Negative for myalgias.  Neurological:  Negative for headaches.  Psychiatric/Behavioral:  Positive for depression. Negative for decreased concentration and suicidal ideas. The patient does not have insomnia.  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) 01/13/2021   Elevated PSA 01/13/2021   Mixed hyperlipidemia 08/22/2017   Essential hypertension 09/24/2016   CKD (chronic kidney disease) stage 3, GFR 30-59 ml/min (HCC) 09/24/2016    Allergies  Allergen Reactions   Tamsulosin Hcl      Causes dizziness    Past Surgical History:  Procedure Laterality Date   COLONOSCOPY N/A 08/16/2022   Procedure: COLONOSCOPY;  Surgeon: Jaynie Collins, DO;  Location: [facility] ENDOSCOPY;  Service: Gastroenterology;  Laterality: N/A;   COLONOSCOPY WITH PROPOFOL N/A 12/16/2014   Procedure: COLONOSCOPY WITH PROPOFOL;  Surgeon: Elnita Maxwell, MD;  Location: [facility] ENDOSCOPY;  Service: Endoscopy;  Laterality: N/A;   PELVIC LYMPH NODE DISSECTION Bilateral 04/27/2021   Procedure: PELVIC LYMPH NODE DISSECTION;  Surgeon: Vanna Scotland, MD;  Location: [facility] ORS;  Service: Urology;  Laterality: Bilateral;   POLYPECTOMY  08/16/2022   Procedure: POLYPECTOMY;  Surgeon: Jaynie Collins, DO;  Location: [facility] ENDOSCOPY;  Service: Gastroenterology;;   PROSTATE BIOPSY     ROBOT ASSISTED LAPAROSCOPIC RADICAL PROSTATECTOMY N/A 04/27/2021   Procedure: XI ROBOTIC ASSISTED LAPAROSCOPIC RADICAL PROSTATECTOMY;  Surgeon: Vanna Scotland, MD;  Location: [facility] ORS;  Service: Urology;  Laterality: N/A;    Social History   Tobacco Use   Smoking status: Former    Current packs/day: 0.00    Average packs/day: 0.5 packs/day for 1 year (0.5 ttl pk-yrs)    Types: Cigarettes    Start date: 10/27/1997    Quit date: 10/28/1998    Years since quitting: 23.9    Passive exposure: Never   Smokeless tobacco: Never  Vaping Use   Vaping status: Never Used  Substance Use Topics   Alcohol use: Yes    Comment: none last 24hrs   Drug use: Not Currently    Comment: marijuana     Medication list has been reviewed and updated.  Current Meds  Medication Sig   fenofibrate (TRICOR) 145 MG tablet Take 1 tablet (145 mg total) by mouth daily.   hydrochlorothiazide (HYDRODIURIL) 25 MG tablet Take 1 tablet (25 mg total) by mouth daily. (Patient taking differently: Take 12.5 mg by mouth daily.)   lisinopril (ZESTRIL) 20 MG tablet One a day   Multiple Vitamin (ONE-A-DAY MENS PO) Take 1 tablet by mouth daily.   pravastatin  (PRAVACHOL) 40 MG tablet Take 1 tablet (40 mg total) by mouth daily.   sertraline (ZOLOFT) 50 MG tablet TAKE 1 TABLET BY MOUTH DAILY   tadalafil (CIALIS) 20 MG tablet Take 1 tablet (20 mg total) by mouth daily as needed for erectile dysfunction.   [DISCONTINUED] co-enzyme Q-10 30 MG capsule Take 30 mg by mouth 3 (three) times daily.       10/18/2022   10:43 AM 04/19/2022   10:15 AM 10/16/2021   10:53 AM 01/13/2021   10:04 AM  GAD 7 : Generalized Anxiety Score  Nervous, Anxious, on Edge 0 0 0 1  Control/stop worrying 0 0 0 1  Worry too much - different things 0 0 0 1  Trouble relaxing 0 0 0 0  Restless 0 0 0 0  Easily annoyed or irritable 0 0 0 1  Afraid - awful might happen 0 0 0 1  Total GAD 7 Score 0 0 0 5  Anxiety Difficulty Not difficult at all Not difficult at all Not difficult at all Not difficult at all  10/18/2022   10:43 AM 04/19/2022   10:14 AM 10/16/2021   10:52 AM  Depression screen PHQ 2/9  Decreased Interest 0 0 0  Down, Depressed, Hopeless 0 0 1  PHQ - 2 Score 0 0 1  Altered sleeping 0 0 1  Tired, decreased energy 0 0 3  Change in appetite 0 0 0  Feeling bad or failure about yourself  0 0 0  Trouble concentrating 0 0 0  Moving slowly or fidgety/restless 0 0 0  Suicidal thoughts 0 0 0  PHQ-9 Score 0 0 5  Difficult doing work/chores Not difficult at all Not difficult at all Somewhat difficult    BP Readings from Last 3 Encounters:  10/18/22 128/78  08/16/22 126/73  07/30/22 (!) 164/84    Physical Exam Vitals and nursing note reviewed.  Constitutional:      General: He is not irritable. HENT:     Head: Normocephalic.     Right Ear: Tympanic membrane and external ear normal.     Left Ear: Tympanic membrane and external ear normal.     Nose: Nose normal.     Mouth/Throat:     Mouth: Mucous membranes are moist.  Eyes:     General: No scleral icterus.       Right eye: No discharge.        Left eye: No discharge.     Conjunctiva/sclera:  Conjunctivae normal.     Pupils: Pupils are equal, round, and reactive to light.  Neck:     Thyroid: No thyromegaly.     Vascular: No JVD.     Trachea: No tracheal deviation.  Cardiovascular:     Rate and Rhythm: Normal rate and regular rhythm.     Heart sounds: Normal heart sounds. No murmur heard.    No friction rub. No gallop.  Pulmonary:     Effort: No respiratory distress.     Breath sounds: Normal breath sounds. No wheezing, rhonchi or rales.  Abdominal:     General: Bowel sounds are normal.     Palpations: Abdomen is soft. There is no mass.     Tenderness: There is no abdominal tenderness. There is no guarding or rebound.  Musculoskeletal:        General: No tenderness. Normal range of motion.     Cervical back: Normal range of motion and neck supple.  Lymphadenopathy:     Cervical: No cervical adenopathy.  Skin:    General: Skin is warm.     Findings: No bruising or rash.  Neurological:     Mental Status: He is alert and oriented to person, place, and time.     Cranial Nerves: No cranial nerve deficit.     Deep Tendon Reflexes: Reflexes are normal and symmetric.     Wt Readings from Last 3 Encounters:  10/18/22 293 lb (132.9 kg)  08/16/22 296 lb 3.2 oz (134.4 kg)  07/30/22 (!) 301 lb (136.5 kg)    BP 128/78   Pulse 64   Ht 6' (1.829 m)   Wt 293 lb (132.9 kg)   SpO2 97%   BMI 39.74 kg/m   Assessment and Plan: 1. Mixed hyperlipidemia Chronic.  Controlled.  Stable.  Continue fenofibrate 145 mg once a day and pravastatin 40 mg once a day.  Recheck up with previous lipid panel is acceptable. - fenofibrate (TRICOR) 145 MG tablet; Take 1 tablet (145 mg total) by mouth daily. - pravastatin (PRAVACHOL) 40 MG tablet; Take 1 tablet (40 mg  total) by mouth daily.  2. Chronic diastolic congestive heart failure (HCC) Chronic.  Controlled.  Stable.  Followed by cardiology.  Continue hydrochlorothiazide 12.5 mg and lisinopril 20 mg once a day. - hydrochlorothiazide  (HYDRODIURIL) 12.5 MG tablet; Take 1 tablet (12.5 mg total) by mouth daily.  Dispense: 90 tablet; Refill: 1 - lisinopril (ZESTRIL) 20 MG tablet; One a day  3. Essential hypertension Chronic.  Controlled.  Stable.  Blood pressure 128/78. - hydrochlorothiazide (HYDRODIURIL) 12.5 MG tablet; Take 1 tablet (12.5 mg total) by mouth daily.  Dispense: 90 tablet; Refill: 1 - lisinopril (ZESTRIL) 20 MG tablet; One a day - Renal Function Panel  4. Major depressive disorder in partial remission, unspecified whether recurrent (HCC) .  Controlled.  Stable.  PHQ 0 GAD score is 0 continue sertraline 50 mg once a day.  Will recheck in 6 months. - sertraline (ZOLOFT) 50 MG tablet; TAKE 1 TABLET BY MOUTH DAILY     Elizabeth Sauer, MD

## 2022-10-19 ENCOUNTER — Encounter: Payer: Self-pay | Admitting: Family Medicine

## 2022-10-30 ENCOUNTER — Other Ambulatory Visit: Payer: Self-pay | Admitting: Family Medicine

## 2022-10-30 DIAGNOSIS — E782 Mixed hyperlipidemia: Secondary | ICD-10-CM

## 2022-10-31 ENCOUNTER — Other Ambulatory Visit: Payer: Self-pay | Admitting: Family Medicine

## 2022-10-31 DIAGNOSIS — I10 Essential (primary) hypertension: Secondary | ICD-10-CM

## 2022-10-31 DIAGNOSIS — I5032 Chronic diastolic (congestive) heart failure: Secondary | ICD-10-CM

## 2022-10-31 DIAGNOSIS — E782 Mixed hyperlipidemia: Secondary | ICD-10-CM

## 2022-11-02 ENCOUNTER — Other Ambulatory Visit: Payer: Self-pay | Admitting: Family Medicine

## 2022-11-02 DIAGNOSIS — E782 Mixed hyperlipidemia: Secondary | ICD-10-CM

## 2022-11-02 NOTE — Telephone Encounter (Signed)
Requested medication (s) are due for refill today: routing for review  Requested medication (s) are on the active medication list: yes  Last refill:  10/18/22  Future visit scheduled: no  Notes to clinic:  Unable to refill per protocol due to failed labs, no updated results.      Requested Prescriptions  Pending Prescriptions Disp Refills   fenofibrate (TRICOR) 145 MG tablet [Pharmacy Med Name: FENOFIBRATE 145 MG TABLET] 30 tablet 5    Sig: TAKE 1 TABLET BY MOUTH EVERY DAY     Cardiovascular:  Antilipid - Fibric Acid Derivatives Failed - 10/30/2022  8:28 AM      Failed - Cr in normal range and within 360 days    Creatinine, Ser  Date Value Ref Range Status  10/18/2022 1.67 (H) 0.61 - 1.24 mg/dL Final         Failed - HGB in normal range and within 360 days    Hemoglobin  Date Value Ref Range Status  04/28/2021 11.3 (L) 13.0 - 17.0 g/dL Final  16/11/9602 54.0 (L) 13.0 - 17.7 g/dL Final         Failed - HCT in normal range and within 360 days    HCT  Date Value Ref Range Status  04/28/2021 33.6 (L) 39.0 - 52.0 % Final   Hematocrit  Date Value Ref Range Status  09/24/2016 38.2 37.5 - 51.0 % Final         Failed - PLT in normal range and within 360 days    Platelets  Date Value Ref Range Status  04/28/2021 204 150 - 400 K/uL Final  09/24/2016 257 150 - 379 x10E3/uL Final         Failed - WBC in normal range and within 360 days    WBC  Date Value Ref Range Status  04/28/2021 10.0 4.0 - 10.5 K/uL Final         Failed - Lipid Panel in normal range within the last 12 months    Cholesterol, Total  Date Value Ref Range Status  10/16/2021 182 100 - 199 mg/dL Final   Cholesterol  Date Value Ref Range Status  04/29/2022 153 0 - 200 mg/dL Final   LDL Chol Calc (NIH)  Date Value Ref Range Status  10/16/2021 106 (H) 0 - 99 mg/dL Final   LDL Cholesterol  Date Value Ref Range Status  04/29/2022 88 0 - 99 mg/dL Final    Comment:           Total Cholesterol/HDL:CHD  Risk Coronary Heart Disease Risk Table                     Men   Women  1/2 Average Risk   3.4   3.3  Average Risk       5.0   4.4  2 X Average Risk   9.6   7.1  3 X Average Risk  23.4   11.0        Use the calculated Patient Ratio above and the CHD Risk Table to determine the patient's CHD Risk.        ATP III CLASSIFICATION (LDL):  <100     mg/dL   Optimal  981-191  mg/dL   Near or Above                    Optimal  130-159  mg/dL   Borderline  478-295  mg/dL   High  >621  mg/dL   Very High Performed at [facility], [address] [place], [place] [address]    HDL  Date Value Ref Range Status  04/29/2022 30 (L) >40 mg/dL Final  60/45/4098 31 (L) >39 mg/dL Final   Triglycerides  Date Value Ref Range Status  04/29/2022 175 (H) <150 mg/dL Final         Passed - ALT in normal range and within 360 days    ALT  Date Value Ref Range Status  04/29/2022 24 0 - 44 U/L Final         Passed - AST in normal range and within 360 days    AST  Date Value Ref Range Status  04/29/2022 22 15 - 41 U/L Final         Passed - eGFR is 30 or above and within 360 days    GFR calc Af Amer  Date Value Ref Range Status  08/15/2020 56  Final   GFR, Estimated  Date Value Ref Range Status  10/18/2022 46 (L) >60 mL/min Final    Comment:    (NOTE) Calculated using the CKD-EPI Creatinine Equation (2021)    eGFR  Date Value Ref Range Status  10/16/2021 55 (L) >59 mL/min/1.73 Final         Passed - Valid encounter within last 12 months    Recent Outpatient Visits           2 weeks ago Essential hypertension   [facility]   6 months ago Essential hypertension   [facility] [facility]   1 year ago Essential hypertension   [place] [facility]   1 year ago Essential  hypertension   [place] [facility]   2 years ago Encounter to establish care   [facility]       Future Appointments             In 9 months Vanna Scotland, MD [facility] [place]

## 2022-11-03 ENCOUNTER — Telehealth: Payer: Self-pay | Admitting: *Deleted

## 2022-11-03 ENCOUNTER — Encounter: Payer: Self-pay | Admitting: Family Medicine

## 2022-11-03 ENCOUNTER — Other Ambulatory Visit: Payer: Self-pay

## 2022-11-03 MED ORDER — FENOFIBRATE 145 MG PO TABS
145.0000 mg | ORAL_TABLET | Freq: Every day | ORAL | 1 refills | Status: DC
Start: 2022-11-03 — End: 2023-05-07
  Filled 2022-11-03: qty 30, 30d supply, fill #0

## 2022-11-03 NOTE — Telephone Encounter (Signed)
  Chief Complaint: Medication Symptoms: NA Frequency: NA Pertinent Negatives: Patient denies NA Disposition: [] ED /[] Urgent Care (no appt availability in office) / [] Appointment(In office/virtual)/ []  [facility]/ [] Home Care/ [] Refused Recommended Disposition /[] [facility]/ []  Follow-up with PCP Additional Notes:   Pt states [facility] [place] does not [facility] Fenof[facility]. Med was sent to [facility]. Called pharmacy, [facility]ford Bad' cancelled and resent to [facility] [place].

## 2022-11-09 ENCOUNTER — Other Ambulatory Visit: Payer: Self-pay | Admitting: Family Medicine

## 2022-11-09 DIAGNOSIS — F324 Major depressive disorder, single episode, in partial remission: Secondary | ICD-10-CM

## 2022-11-10 NOTE — Telephone Encounter (Signed)
Requested Prescriptions  Pending Prescriptions Disp Refills   sertraline (ZOLOFT) 50 MG tablet [Pharmacy Med Name: SERTRALINE HCL 50 MG TABLET] 30 tablet 5    Sig: TAKE 1 TABLET BY MOUTH EVERY DAY     Psychiatry:  Antidepressants - SSRI - sertraline Passed - 11/09/2022  1:38 AM      Passed - AST in normal range and within 360 days    AST  Date Value Ref Range Status  04/29/2022 22 15 - 41 U/L Final         Passed - ALT in normal range and within 360 days    ALT  Date Value Ref Range Status  04/29/2022 24 0 - 44 U/L Final         Passed - Completed PHQ-2 or PHQ-9 in the last 360 days      Passed - Valid encounter within last 6 months    Recent Outpatient Visits           3 weeks ago Essential hypertension   [place] [facility]   6 months ago Essential hypertension   [place] [facility]   1 year ago Essential hypertension   [facility]   1 year ago Essential hypertension   [place] [facility]   2 years ago Encounter to establish care   [facility]       Future Appointments             In 8 months Vanna Scotland, MD [facility] [place]

## 2022-11-17 ENCOUNTER — Encounter: Payer: Self-pay | Admitting: Family Medicine

## 2022-11-19 ENCOUNTER — Encounter: Payer: Self-pay | Admitting: Family Medicine

## 2022-11-19 ENCOUNTER — Other Ambulatory Visit
Admission: RE | Admit: 2022-11-19 | Discharge: 2022-11-19 | Disposition: A | Discharge status: Home / Self Care | Payer: 59 | Attending: Family Medicine | Admitting: Family Medicine

## 2022-11-19 ENCOUNTER — Ambulatory Visit (INDEPENDENT_AMBULATORY_CARE_PROVIDER_SITE_OTHER): Payer: 59 | Admitting: Family Medicine

## 2022-11-19 VITALS — BP 120/78 | HR 71 | Ht 72.0 in | Wt 293.0 lb

## 2022-11-19 DIAGNOSIS — K921 Melena: Secondary | ICD-10-CM | POA: Insufficient documentation

## 2022-11-19 DIAGNOSIS — K922 Gastrointestinal hemorrhage, unspecified: Secondary | ICD-10-CM | POA: Diagnosis not present

## 2022-11-19 DIAGNOSIS — Z23 Encounter for immunization: Secondary | ICD-10-CM

## 2022-11-19 LAB — CBC WITH DIFFERENTIAL/PLATELET
Abs Immature Granulocytes: 0.04 10*3/uL (ref 0.00–0.07)
Basophils Absolute: 0 10*3/uL (ref 0.0–0.1)
Basophils Relative: 1 %
Eosinophils Absolute: 0.1 10*3/uL (ref 0.0–0.5)
Eosinophils Relative: 1 %
HCT: 33.9 % — ABNORMAL LOW (ref 39.0–52.0)
Hemoglobin: 11.6 g/dL — ABNORMAL LOW (ref 13.0–17.0)
Immature Granulocytes: 1 %
Lymphocytes Relative: 22 %
Lymphs Abs: 1.2 10*3/uL (ref 0.7–4.0)
MCH: 33 pg (ref 26.0–34.0)
MCHC: 34.2 g/dL (ref 30.0–36.0)
MCV: 96.3 fL (ref 80.0–100.0)
Monocytes Absolute: 0.5 10*3/uL (ref 0.1–1.0)
Monocytes Relative: 10 %
Neutro Abs: 3.7 10*3/uL (ref 1.7–7.7)
Neutrophils Relative %: 65 %
Platelets: 262 10*3/uL (ref 150–400)
RBC: 3.52 MIL/uL — ABNORMAL LOW (ref 4.22–5.81)
RDW: 11.8 % (ref 11.5–15.5)
WBC: 5.6 10*3/uL (ref 4.0–10.5)
nRBC: 0 % (ref 0.0–0.2)

## 2022-11-19 LAB — RENAL FUNCTION PANEL
Albumin: 4.5 g/dL (ref 3.5–5.0)
Anion gap: 7 (ref 5–15)
BUN: 22 mg/dL (ref 8–23)
CO2: 26 mmol/L (ref 22–32)
Calcium: 9.3 mg/dL (ref 8.9–10.3)
Chloride: 106 mmol/L (ref 98–111)
Creatinine, Ser: 1.59 mg/dL — ABNORMAL HIGH (ref 0.61–1.24)
GFR, Estimated: 48 mL/min — ABNORMAL LOW (ref >60)
Glucose, Bld: 109 mg/dL — ABNORMAL HIGH (ref 70–99)
Phosphorus: 2.5 mg/dL (ref 2.5–4.6)
Potassium: 3.6 mmol/L (ref 3.5–5.1)
Sodium: 139 mmol/L (ref 135–145)

## 2022-11-19 MED ORDER — PANTOPRAZOLE SODIUM 40 MG PO TBEC
40.0000 mg | DELAYED_RELEASE_TABLET | Freq: Every day | ORAL | 3 refills | Status: DC
Start: 2022-11-19 — End: 2023-02-07

## 2022-11-19 NOTE — Progress Notes (Signed)
Date:  11/19/2022   Name:  Alejandro Winters   DOB:  27-Apr-1960   MRN:  [id number]   Chief Complaint: Blood In Stools and Flu Vaccine  GI Problem Primary symptoms do not include weight loss, fatigue, abdominal pain, nausea, vomiting, diarrhea, melena, hematemesis, hematochezia or dysuria.  The illness does not include constipation.    Lab Results  Component Value Date   NA 139 11/19/2022   K 3.6 11/19/2022   CO2 26 11/19/2022   GLUCOSE 109 (H) 11/19/2022   BUN 22 11/19/2022   CREATININE 1.59 (H) 11/19/2022   CALCIUM 9.3 11/19/2022   EGFR 55 (L) 10/16/2021   GFRNONAA 48 (L) 11/19/2022   Lab Results  Component Value Date   CHOL 153 04/29/2022   HDL 30 (L) 04/29/2022   LDLCALC 88 04/29/2022   TRIG 175 (H) 04/29/2022   CHOLHDL 5.1 04/29/2022   Lab Results  Component Value Date   TSH 2.05 08/15/2020   Lab Results  Component Value Date   HGBA1C 4.7 08/15/2020   Lab Results  Component Value Date   WBC 5.6 11/19/2022   HGB 11.6 (L) 11/19/2022   HCT 33.9 (L) 11/19/2022   MCV 96.3 11/19/2022   PLT 262 11/19/2022   Lab Results  Component Value Date   ALT 24 04/29/2022   AST 22 04/29/2022   ALKPHOS 31 (L) 04/29/2022   BILITOT 0.6 04/29/2022   Lab Results  Component Value Date   VD25OH 30 08/15/2020     Review of Systems  Constitutional:  Negative for fatigue and weight loss.  Cardiovascular:  Negative for chest pain.  Gastrointestinal:  Positive for anal bleeding and blood in stool. Negative for abdominal pain, constipation, diarrhea, hematemesis, hematochezia, melena, nausea and vomiting.  Genitourinary:  Negative for dysuria.    Patient Active Problem List   Diagnosis Date Noted  . Prostate cancer (HCC) 04/27/2021  . 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 Elevated PSA 01/13/2021  . Mixed hyperlipidemia 08/22/2017  . Essential hypertension 09/24/2016  . CKD (chronic kidney disease) stage 3, GFR 30-59 ml/min (HCC) 09/24/2016     Allergies  Allergen Reactions  . Tamsulosin Hcl     Causes dizziness    Past Surgical History:  Procedure Laterality Date  . COLONOSCOPY N/A 08/16/2022   Procedure: COLONOSCOPY;  Surgeon: Jaynie Collins, DO;  Location: [facility] ENDOSCOPY;  Service: Gastroenterology;  Laterality: N/A;  . COLONOSCOPY WITH PROPOFOL N/A 12/16/2014   Procedure: COLONOSCOPY WITH PROPOFOL;  Surgeon: Elnita Maxwell, MD;  Location: [facility] ENDOSCOPY;  Service: Endoscopy;  Laterality: N/A;  . PELVIC LYMPH NODE DISSECTION Bilateral 04/27/2021   Procedure: PELVIC LYMPH NODE DISSECTION;  Surgeon: Vanna Scotland, MD;  Location: [facility] ORS;  Service: Urology;  Laterality: Bilateral;  . POLYPECTOMY  08/16/2022   Procedure: POLYPECTOMY;  Surgeon: Jaynie Collins, DO;  Location: [facility] ENDOSCOPY;  Service: Gastroenterology;;  . PROSTATE BIOPSY    . ROBOT ASSISTED LAPAROSCOPIC RADICAL PROSTATECTOMY N/A 04/27/2021   Procedure: XI ROBOTIC ASSISTED LAPAROSCOPIC RADICAL PROSTATECTOMY;  Surgeon: Vanna Scotland, MD;  Location: [facility] ORS;  Service: Urology;  Laterality: N/A;    Social History   Tobacco Use  . Smoking status: Former    Current packs/day: 0.00    Average packs/day: 0.5 packs/day for 1 year (0.5 ttl pk-yrs)    Types: Cigarettes    Start date: 10/27/1997    Quit date: 10/28/1998    Years since quitting: 24.0    Passive exposure: Never  .  Smokeless tobacco: Never  Vaping Use  . Vaping status: Never Used  Substance Use Topics  . Alcohol use: Yes    Comment: none last 24hrs  . Drug use: Not Currently    Comment: marijuana     Medication list has been reviewed and updated.  Current Meds  Medication Sig  . fenofibrate (TRICOR) 145 MG tablet Take 1 tablet (145 mg total) by mouth daily.  . hydrochlorothiazide (HYDRODIURIL) 12.5 MG tablet Take 1 tablet (12.5 mg total) by mouth daily.  Marland Kitchen lisinopril (ZESTRIL) 20 MG tablet TAKE 1 TABLET BY MOUTH EVERY DAY  . Multiple Vitamin (ONE-A-DAY MENS PO) Take  1 tablet by mouth daily.  . pravastatin (PRAVACHOL) 40 MG tablet TAKE 1 TABLET BY MOUTH EVERY DAY  . sertraline (ZOLOFT) 50 MG tablet TAKE 1 TABLET BY MOUTH EVERY DAY  . tadalafil (CIALIS) 20 MG tablet Take 1 tablet (20 mg total) by mouth daily as needed for erectile dysfunction.       11/19/2022   10:35 AM 10/18/2022   10:43 AM 04/19/2022   10:15 AM 10/16/2021   10:53 AM  GAD 7 : Generalized Anxiety Score  Nervous, Anxious, on Edge 0 0 0 0  Control/stop worrying 0 0 0 0  Worry too much - different things 0 0 0 0  Trouble relaxing 0 0 0 0  Restless 0 0 0 0  Easily annoyed or irritable 0 0 0 0  Afraid - awful might happen 0 0 0 0  Total GAD 7 Score 0 0 0 0  Anxiety Difficulty Not difficult at all Not difficult at all Not difficult at all Not difficult at all       11/19/2022   10:35 AM 10/18/2022   10:43 AM 04/19/2022   10:14 AM  Depression screen PHQ 2/9  Decreased Interest 0 0 0  Down, Depressed, Hopeless 0 0 0  PHQ - 2 Score 0 0 0  Altered sleeping 0 0 0  Tired, decreased energy 0 0 0  Change in appetite 0 0 0  Feeling bad or failure about yourself  0 0 0  Trouble concentrating 0 0 0  Moving slowly or fidgety/restless 0 0 0  Suicidal thoughts 0 0 0  PHQ-9 Score 0 0 0  Difficult doing work/chores Not difficult at all Not difficult at all Not difficult at all    BP Readings from Last 3 Encounters:  11/19/22 120/78  10/18/22 128/78  08/16/22 126/73    Physical Exam Vitals and nursing note reviewed.  HENT:     Head: Normocephalic.     Right Ear: Tympanic membrane and external ear normal. There is no impacted cerumen.     Left Ear: Tympanic membrane and external ear normal. There is no impacted cerumen.     Nose: Nose normal.  Eyes:     General: No scleral icterus.       Right eye: No discharge.        Left eye: No discharge.     Conjunctiva/sclera: Conjunctivae normal.     Pupils: Pupils are equal, round, and reactive to light.  Neck:     Thyroid: No  thyromegaly.     Vascular: No JVD.     Trachea: No tracheal deviation.  Cardiovascular:     Rate and Rhythm: Normal rate and regular rhythm.     Heart sounds: Normal heart sounds. No murmur heard.    No friction rub. No gallop.  Pulmonary:     Effort: No  respiratory distress.     Breath sounds: Normal breath sounds. No wheezing or rales.  Abdominal:     General: Bowel sounds are normal.     Palpations: Abdomen is soft. There is no mass.     Tenderness: There is no abdominal tenderness. There is no guarding or rebound.  Genitourinary:    Prostate: Normal. Not enlarged, not tender and no nodules present.     Rectum: Guaiac result positive. Internal hemorrhoid present. No mass, tenderness or external hemorrhoid.  Musculoskeletal:        General: No tenderness. Normal range of motion.     Cervical back: Normal range of motion and neck supple.  Lymphadenopathy:     Cervical: No cervical adenopathy.  Skin:    General: Skin is warm.     Findings: No rash.  Neurological:     Mental Status: He is alert and oriented to person, place, and time.     Cranial Nerves: No cranial nerve deficit.     Deep Tendon Reflexes: Reflexes are normal and symmetric.    Wt Readings from Last 3 Encounters:  11/19/22 293 lb (132.9 kg)  10/18/22 293 lb (132.9 kg)  08/16/22 296 lb 3.2 oz (134.4 kg)    BP 120/78   Pulse 71   Ht 6' (1.829 m)   Wt 293 lb (132.9 kg)   SpO2 96%   BMI 39.74 kg/m   Assessment and Plan:  1. Gastrointestinal hemorrhage, unspecified gastrointestinal hemorrhage type New onset.  Currently controlled.  Currently stable.  With normal vital signs.  Patient has not had any blood that he has noticed today and on rectal exam there was no gross blood seen but had a positive guaiac.  He has got multiple reasons that he could be having bleeding from a lower GI standpoint has diverticulosis extensive as well as internal hemorrhoids.  But this may be an upper GI in nature and I have placed  him on pantoprazole once a day to reduce acid and we have an upcoming appointment Monday for follow-up.  CBC was done and patient is mildly anemic but is relatively stable with previous readings.  Patient has an upcoming appointment with GI on Monday for follow-up and has been instructed to go to the ER if significant bleeding should recur. - pantoprazole (PROTONIX) 40 MG tablet; Take 1 tablet (40 mg total) by mouth daily.  Dispense: 30 tablet; Refill: 3  2. Flu vaccine need Discussed and administered - Flu vaccine trivalent PF, 6mos and older(Flulaval,Afluria,Fluarix,Fluzone)    Elizabeth Sauer, MD

## 2022-11-22 DIAGNOSIS — K921 Melena: Secondary | ICD-10-CM | POA: Diagnosis not present

## 2022-11-22 DIAGNOSIS — Z86010 Personal history of colonic polyps: Secondary | ICD-10-CM | POA: Diagnosis not present

## 2022-11-22 DIAGNOSIS — K5730 Diverticulosis of large intestine without perforation or abscess without bleeding: Secondary | ICD-10-CM | POA: Diagnosis not present

## 2023-02-06 ENCOUNTER — Other Ambulatory Visit: Payer: Self-pay | Admitting: Family Medicine

## 2023-02-06 DIAGNOSIS — K922 Gastrointestinal hemorrhage, unspecified: Secondary | ICD-10-CM

## 2023-03-01 DIAGNOSIS — G4733 Obstructive sleep apnea (adult) (pediatric): Secondary | ICD-10-CM | POA: Diagnosis not present

## 2023-03-03 ENCOUNTER — Other Ambulatory Visit: Payer: Self-pay | Admitting: Family Medicine

## 2023-03-03 DIAGNOSIS — I10 Essential (primary) hypertension: Secondary | ICD-10-CM

## 2023-03-03 DIAGNOSIS — I5032 Chronic diastolic (congestive) heart failure: Secondary | ICD-10-CM

## 2023-04-07 ENCOUNTER — Encounter: Payer: Self-pay | Admitting: Family Medicine

## 2023-04-09 ENCOUNTER — Other Ambulatory Visit: Payer: Self-pay | Admitting: Family Medicine

## 2023-04-09 DIAGNOSIS — E782 Mixed hyperlipidemia: Secondary | ICD-10-CM

## 2023-04-19 ENCOUNTER — Other Ambulatory Visit
Admission: RE | Admit: 2023-04-19 | Discharge: 2023-04-19 | Disposition: A | Discharge status: Home / Self Care | Payer: 59 | Attending: Family Medicine | Admitting: Family Medicine

## 2023-04-19 ENCOUNTER — Ambulatory Visit (INDEPENDENT_AMBULATORY_CARE_PROVIDER_SITE_OTHER): Payer: 59 | Admitting: Family Medicine

## 2023-04-19 ENCOUNTER — Encounter: Payer: Self-pay | Admitting: Family Medicine

## 2023-04-19 VITALS — BP 136/82 | HR 73 | Ht 72.0 in | Wt 299.0 lb

## 2023-04-19 DIAGNOSIS — Z0000 Encounter for general adult medical examination without abnormal findings: Secondary | ICD-10-CM | POA: Diagnosis not present

## 2023-04-19 LAB — COMPREHENSIVE METABOLIC PANEL
ALT: 24 U/L (ref 0–44)
AST: 20 U/L (ref 15–41)
Albumin: 4.6 g/dL (ref 3.5–5.0)
Alkaline Phosphatase: 28 U/L — ABNORMAL LOW (ref 38–126)
Anion gap: 9 (ref 5–15)
BUN: 23 mg/dL (ref 8–23)
CO2: 26 mmol/L (ref 22–32)
Calcium: 9.9 mg/dL (ref 8.9–10.3)
Chloride: 103 mmol/L (ref 98–111)
Creatinine, Ser: 1.61 mg/dL — ABNORMAL HIGH (ref 0.61–1.24)
GFR, Estimated: 47 mL/min — ABNORMAL LOW (ref >60)
Glucose, Bld: 99 mg/dL (ref 70–99)
Potassium: 3.8 mmol/L (ref 3.5–5.1)
Sodium: 138 mmol/L (ref 135–145)
Total Bilirubin: 0.9 mg/dL (ref 0.0–1.2)
Total Protein: 7.8 g/dL (ref 6.5–8.1)

## 2023-04-19 LAB — LIPID PANEL
Cholesterol: 147 mg/dL (ref 0–200)
HDL: 30 mg/dL — ABNORMAL LOW (ref >40)
LDL Cholesterol: 83 mg/dL (ref 0–99)
Total CHOL/HDL Ratio: 4.9 {ratio}
Triglycerides: 169 mg/dL — ABNORMAL HIGH (ref <150)
VLDL: 34 mg/dL (ref 0–40)

## 2023-04-19 NOTE — Progress Notes (Addendum)
Date:  04/19/2023   Name:  Alejandro Winters   DOB:  1960-05-03   MRN:  [id number]   Chief Complaint: Annual Exam  Patient is a 63 year old male who presents for a comprehensive physical exam. The patient reports the following problems: none. Health maintenance has been reviewed up to date.      Lab Results  Component Value Date   NA 139 11/19/2022   K 3.6 11/19/2022   CO2 26 11/19/2022   GLUCOSE 109 (H) 11/19/2022   BUN 22 11/19/2022   CREATININE 1.59 (H) 11/19/2022   CALCIUM 9.3 11/19/2022   EGFR 55 (L) 10/16/2021   GFRNONAA 48 (L) 11/19/2022   Lab Results  Component Value Date   CHOL 153 04/29/2022   HDL 30 (L) 04/29/2022   LDLCALC 88 04/29/2022   TRIG 175 (H) 04/29/2022   CHOLHDL 5.1 04/29/2022   Lab Results  Component Value Date   TSH 2.05 08/15/2020   Lab Results  Component Value Date   HGBA1C 4.7 08/15/2020   Lab Results  Component Value Date   WBC 5.6 11/19/2022   HGB 11.6 (L) 11/19/2022   HCT 33.9 (L) 11/19/2022   MCV 96.3 11/19/2022   PLT 262 11/19/2022   Lab Results  Component Value Date   ALT 24 04/29/2022   AST 22 04/29/2022   ALKPHOS 31 (L) 04/29/2022   BILITOT 0.6 04/29/2022   Lab Results  Component Value Date   VD25OH 30 08/15/2020     Review of Systems  Constitutional:  Negative for chills and fever.  HENT:  Negative for drooling, ear discharge, ear pain and sore throat.   Respiratory:  Negative for cough, shortness of breath and wheezing.   Cardiovascular:  Negative for chest pain, palpitations and leg swelling.  Gastrointestinal:  Negative for abdominal pain, blood in stool, constipation, diarrhea and nausea.  Endocrine: Negative for polydipsia.  Genitourinary:  Negative for dysuria, frequency, hematuria and urgency.  Musculoskeletal:  Negative for back pain, myalgias and neck pain.  Skin:  Negative for rash.  Allergic/Immunologic: Negative for environmental allergies.  Neurological:  Negative for dizziness and headaches.   Hematological:  Does not bruise/bleed easily.  Psychiatric/Behavioral:  Negative for suicidal ideas. The patient is not nervous/anxious.     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 ENDOSCOPY;  Service: Gastroenterology;  Laterality: N/A;   COLONOSCOPY WITH PROPOFOL N/A 12/16/2014   Procedure: COLONOSCOPY WITH PROPOFOL;  Surgeon: Elnita Maxwell, MD;  Location: [facility] ENDOSCOPY;  Service: Endoscopy;  Laterality: N/A;   PELVIC LYMPH NODE DISSECTION Bilateral 04/27/2021   Procedure: PELVIC LYMPH NODE DISSECTION;  Surgeon: Vanna Scotland, MD;  Location: [facility] ORS;  Service: Urology;  Laterality: Bilateral;   POLYPECTOMY  08/16/2022   Procedure: POLYPECTOMY;  Surgeon: Jaynie Collins, DO;  Location: [facility] ENDOSCOPY;  Service: Gastroenterology;;   PROSTATE BIOPSY     ROBOT ASSISTED LAPAROSCOPIC RADICAL PROSTATECTOMY N/A 04/27/2021   Procedure: XI ROBOTIC ASSISTED LAPAROSCOPIC RADICAL  PROSTATECTOMY;  Surgeon: Vanna Scotland, MD;  Location: [facility] ORS;  Service: Urology;  Laterality: N/A;    Social History   Tobacco Use   Smoking status: Former    Current packs/day: 0.00    Average packs/day: 0.5 packs/day for 1 year (0.5 ttl pk-yrs)    Types: Cigarettes    Start date: 10/27/1997    Quit date: 10/28/1998    Years since quitting: 24.4    Passive exposure: Never   Smokeless tobacco: Never  Vaping Use   Vaping status: Never Used  Substance Use Topics   Alcohol use: Yes     Comment: none last 24hrs   Drug use: Not Currently    Comment: marijuana     Medication list has been reviewed and updated.  Current Meds  Medication Sig   fenofibrate (TRICOR) 145 MG tablet Take 1 tablet (145 mg total) by mouth daily.   hydrochlorothiazide (HYDRODIURIL) 12.5 MG tablet Take 1 tablet (12.5 mg total) by mouth daily.   lisinopril (ZESTRIL) 20 MG tablet TAKE 1 TABLET BY MOUTH EVERY DAY   Multiple Vitamin (ONE-A-DAY MENS PO) Take 1 tablet by mouth daily.   pravastatin (PRAVACHOL) 40 MG tablet TAKE 1 TABLET BY MOUTH EVERY DAY   sertraline (ZOLOFT) 50 MG tablet TAKE 1 TABLET BY MOUTH EVERY DAY   tadalafil (CIALIS) 20 MG tablet Take 1 tablet (20 mg total) by mouth daily as needed for erectile dysfunction.   tadalafil, PAH, (ADCIRCA) 20 MG tablet Take 20 mg by mouth daily as needed.       04/19/2023   10:16 AM 11/19/2022   10:35 AM 10/18/2022   10:43 AM 04/19/2022   10:15 AM  GAD 7 : Generalized Anxiety Score  Nervous, Anxious, on Edge 0 0 0 0  Control/stop worrying 0 0 0 0  Worry too much - different things 0 0 0 0  Trouble relaxing 0 0 0 0  Restless 0 0 0 0  Easily annoyed or irritable 0 0 0 0  Afraid - awful might happen 0 0 0 0  Total GAD 7 Score 0 0 0 0  Anxiety Difficulty Not difficult at all Not difficult at all Not difficult at all Not difficult at all       04/19/2023   10:15 AM 11/19/2022   10:35 AM 10/18/2022   10:43 AM  Depression screen PHQ 2/9  Decreased Interest 0 0 0  Down, Depressed, Hopeless 0 0 0  PHQ - 2 Score 0 0 0  Altered sleeping 2 0 0  Tired, decreased energy 1 0 0  Change in appetite 0 0 0  Feeling bad or failure about yourself  0 0 0  Trouble concentrating 0 0 0  Moving slowly or fidgety/restless 0 0 0  Suicidal thoughts 0 0 0  PHQ-9 Score 3 0 0  Difficult doing work/chores Not difficult at all Not difficult at all Not difficult at all    BP Readings from Last 3 Encounters:  04/19/23 136/82  11/19/22 120/78  10/18/22 128/78     Physical Exam Vitals and nursing note reviewed.  Constitutional:      Appearance: He is well-developed.  HENT:     Head: Normocephalic and atraumatic.     Jaw: There is normal jaw occlusion.     Right Ear: Hearing, tympanic membrane, ear canal and external ear normal.     Left Ear: Hearing, tympanic membrane, ear canal and external ear normal.     Nose: Nose normal. No  congestion or rhinorrhea.     Mouth/Throat:     Mouth: Mucous membranes are moist.     Dentition: Normal dentition.     Tongue: No lesions.     Palate: No mass.     Pharynx: Oropharynx is clear. Uvula midline.  Eyes:     General: Lids are normal. Vision grossly intact. Gaze aligned appropriately. No scleral icterus.    Conjunctiva/sclera: Conjunctivae normal.     Pupils: Pupils are equal, round, and reactive to light.     Funduscopic exam:    Right eye: Red reflex present.        Left eye: Red reflex present. Neck:     Thyroid: No thyroid mass, thyromegaly or thyroid tenderness.     Vascular: Normal carotid pulses. No carotid bruit, hepatojugular reflux or JVD.     Trachea: Trachea normal. No tracheal deviation.  Cardiovascular:     Rate and Rhythm: Normal rate and regular rhythm.     Pulses: Normal pulses.     Heart sounds: Normal heart sounds, S1 normal and S2 normal. No murmur heard.    No systolic murmur is present.     No diastolic murmur is present.     No gallop. No S3 or S4 sounds.  Pulmonary:     Effort: Pulmonary effort is normal.     Breath sounds: Normal breath sounds. No decreased breath sounds, wheezing, rhonchi or rales.  Chest:  Breasts:    Right: Normal. No mass.     Left: Normal. No mass.  Abdominal:     General: Bowel sounds are normal.     Palpations: Abdomen is soft. There is no hepatomegaly, splenomegaly or mass.     Tenderness: There is no abdominal tenderness.     Hernia: There is no hernia in the left inguinal area.  Genitourinary:    Penis: Normal and circumcised.       Testes: Normal.     Prostate: Normal.     Rectum: Normal. Guaiac result negative. No mass.     Comments: Normal exam for prostatic removal with no palpable prostate or remnants noted Musculoskeletal:        General: Normal range of motion.     Cervical back: Normal range of motion and neck supple.     Right lower leg: No edema.     Left lower leg: No edema.  Lymphadenopathy:     Cervical: No cervical adenopathy.     Right cervical: No superficial, deep or posterior cervical adenopathy.    Left cervical: No superficial, deep or posterior cervical adenopathy.     Upper Body:     Right upper body: No supraclavicular or axillary adenopathy.     Left upper body: No supraclavicular or axillary adenopathy.  Skin:    General: Skin is warm and dry.     Findings: No rash.     Comments: Noted vitiligo  Neurological:     General: No focal deficit present.     Mental Status: He is alert and oriented to person, place, and time.     Cranial Nerves: Cranial nerves 2-12 are intact.     Sensory: Sensation is intact. No sensory deficit.     Motor: Motor function is intact.     Deep Tendon Reflexes: Reflexes are normal and symmetric.  Psychiatric:        Mood and Affect: Mood is not anxious or depressed.        Behavior: Behavior is cooperative.  Wt Readings from Last 3 Encounters:  04/19/23 299 lb (135.6 kg)  11/19/22 293 lb (132.9 kg)  10/18/22 293 lb (132.9 kg)    BP 136/82   Pulse 73   Ht 6' (1.829 m)   Wt 299 lb (135.6 kg)   SpO2 95%   BMI 40.55 kg/m   Assessment and Plan: Alejandro Winters is a 63 y.o. male who presents today for his Complete Annual Exam. He feels well. He reports exercising . He reports he is sleeping fairly well.  Immunizations are reviewed and recommendations provided.   Age appropriate screening tests are discussed. Counseling given for risk factor reduction interventions.   1. Annual physical exam (Primary) No subjective/objective concerns noted during  HPI/review of past medical history/past labs within the year/current medications, review of systems and physical exam.  Will obtain lipid panel and comprehensive metabolic panel.  Will defer PSA to urology. - Lipid panel - Comprehensive metabolic panel   Elizabeth Sauer, MD

## 2023-04-20 ENCOUNTER — Encounter: Payer: Self-pay | Admitting: Family Medicine

## 2023-04-20 NOTE — Patient Instructions (Signed)

## 2023-05-07 ENCOUNTER — Other Ambulatory Visit: Payer: Self-pay | Admitting: Family Medicine

## 2023-05-07 DIAGNOSIS — E782 Mixed hyperlipidemia: Secondary | ICD-10-CM

## 2023-05-27 DIAGNOSIS — G4733 Obstructive sleep apnea (adult) (pediatric): Secondary | ICD-10-CM | POA: Diagnosis not present

## 2023-06-01 ENCOUNTER — Telehealth: Payer: Self-pay

## 2023-06-01 DIAGNOSIS — G4733 Obstructive sleep apnea (adult) (pediatric): Secondary | ICD-10-CM

## 2023-06-01 NOTE — Telephone Encounter (Signed)
 Cpap order placed and faxed to [facility] [phone]

## 2023-06-01 NOTE — Telephone Encounter (Signed)
 Copied from CRM [id number]. Topic: Clinical - Medical Advice >> May 31, 2023  5:47 PM Everette C wrote: Reason for CRM: The patient has called to request orders for a CPAP machine to be submitted to [facility] via fax at [phone]   Please contact the patient further if needed

## 2023-06-03 ENCOUNTER — Other Ambulatory Visit: Payer: Self-pay | Admitting: Family Medicine

## 2023-06-03 DIAGNOSIS — F324 Major depressive disorder, single episode, in partial remission: Secondary | ICD-10-CM

## 2023-06-06 ENCOUNTER — Telehealth: Payer: Self-pay

## 2023-06-06 NOTE — Telephone Encounter (Signed)
 Requested Prescriptions  Pending Prescriptions Disp Refills   sertraline (ZOLOFT) 50 MG tablet [Pharmacy Med Name: SERTRALINE HCL 50 MG TABLET] 90 tablet 0    Sig: TAKE 1 TABLET BY MOUTH EVERY DAY     Psychiatry:  Antidepressants - SSRI - sertraline Passed - 06/06/2023  9:33 AM      Passed - AST in normal range and within 360 days    AST  Date Value Ref Range Status  04/19/2023 20 15 - 41 U/L Final         Passed - ALT in normal range and within 360 days    ALT  Date Value Ref Range Status  04/19/2023 24 0 - 44 U/L Final         Passed - Completed PHQ-2 or PHQ-9 in the last 360 days      Passed - Valid encounter within last 6 months    Recent Outpatient Visits           1 month ago Annual physical exam   [facility] In[facility], MD       Future Appointments             In 2 months Vanna Scotland, MD [facility] [place]

## 2023-06-06 NOTE — Telephone Encounter (Signed)
 Spoke with patient and gave him Dr. Elenore Rota phone number so he can get the script through hm.  Dr. Elenore Rota is the one who dx him with OSA.   JM  Copied from CRM [id number]. Topic: Clinical - Prescription Issue >> Jun 03, 2023  3:05 PM Fonda Kinder J wrote: Reason for CRM: Pt states [facility] never received the fax for his cpap but one of their reps advised him she could see the order in [facility] systems and the settings on the order was incorrect. He doesn't know what it's supposed say though and she didn't tell him. He did advise me that [facility] would be sending over more communications and to be on the look out for it

## 2023-06-10 NOTE — Telephone Encounter (Signed)
 Pt called back stating that in order for him to have Dr. Elenore Rota to sign the CPAP order, he has to come in for an appt which costs him more than he wants to pay oop. He says that [facility] sent the order to Dr. Yetta Barre. Please follow up and confirm to the patient if he has to have Dr. Elenore Rota do this or will Dr. Yetta Barre be willing to for him. Cb# [phone].

## 2023-06-10 NOTE — Telephone Encounter (Signed)
 Called pt left VM that pthas to go to Dr. Elenore Rota for all CPAP needs. Name stated on VM.  KP

## 2023-06-15 DIAGNOSIS — G4733 Obstructive sleep apnea (adult) (pediatric): Secondary | ICD-10-CM | POA: Diagnosis not present

## 2023-07-15 DIAGNOSIS — G4733 Obstructive sleep apnea (adult) (pediatric): Secondary | ICD-10-CM | POA: Diagnosis not present

## 2023-08-05 ENCOUNTER — Ambulatory Visit: Payer: Self-pay | Admitting: Urology

## 2023-08-10 ENCOUNTER — Other Ambulatory Visit: Payer: Self-pay | Admitting: Urology

## 2023-08-12 ENCOUNTER — Ambulatory Visit: Admitting: Urology

## 2023-08-15 DIAGNOSIS — G4733 Obstructive sleep apnea (adult) (pediatric): Secondary | ICD-10-CM | POA: Diagnosis not present

## 2023-08-29 DIAGNOSIS — G4733 Obstructive sleep apnea (adult) (pediatric): Secondary | ICD-10-CM | POA: Diagnosis not present

## 2023-09-01 ENCOUNTER — Other Ambulatory Visit
Admission: RE | Admit: 2023-09-01 | Discharge: 2023-09-01 | Disposition: A | Discharge status: Home / Self Care | Attending: Urology | Admitting: Urology

## 2023-09-01 ENCOUNTER — Other Ambulatory Visit: Payer: Self-pay

## 2023-09-01 DIAGNOSIS — C61 Malignant neoplasm of prostate: Secondary | ICD-10-CM | POA: Insufficient documentation

## 2023-09-01 LAB — PSA: Prostatic Specific Antigen: <0.01 ng/mL (ref 0.00–4.00)

## 2023-09-02 NOTE — Progress Notes (Deleted)
 07/30/2022 5:58 AM   Alejandro Winters [birth year] [id number]  Referring provider: Joshua Cathryne BROCKS, MD No address on file  Urological history: 1. Prostate cancer - PSA (08/2023) <0.01 - unfavorable intermediate risk prostate cancer - bx (2023) iPSA 5.74, Gleason 3+3 involving 3 cores affecting up to 80%, Gleason 3+4 involving 6 cores affecting up to 100%, Gleason 4+3 involving 1 core affecting up to 50%. TRUS volume was 48gm.   - Robotic radical prostatectomy w/ lymph node dissection (2023) fat pad and lymph nodes negative for malignancy. 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 With extraprostatic extension and right seminal vesicle invasion.   2. Renal cyst - CT (2023) left benign renal cyst  No chief complaint on file.   HPI: 63 year-old man with a personal history of unfavorable intermediate risk prostate cancer. He presents today for annual follow up.   Previous records reviewed.     His most recent PSA was undetectable.   He denies any bone pain, weight loss, or consitutional symptoms.   He reports good appetite and energy***  He is taking calcium and vitamin D.    ***  He continues to have hot flashes ***   He is also followed by ***  Urinary symptoms ***   ED***    He was previously experiencing significant urinary incontinence and erectile dysfunction post-surgery. He reports that he no longer experiences urinary leakage, even with activities such as sneezing or bending down, and does not require pads or diapers. He does, however, experience occasional urgency. His urinary stream is strong, and he feels he is emptying his bladder completely. Despite taking a low dose of Cialis  daily, he has not achieved any natural erections. He is interested in exploring more aggressive treatments for erectile dysfunction.  PSA (08/2023) <0.01  Serum creatinine (04/2023) 1.61, eGFR 47  PMH: Past Medical History:  Diagnosis Date   Cancer (HCC)     prostate   CHF (congestive heart failure) (HCC)    Depression    Hyperlipemia    Hypertension    Kidney failure    Sleep apnea    Vitiligo     Surgical History: Past Surgical History:  Procedure Laterality Date   COLONOSCOPY N/A 08/16/2022   Procedure: COLONOSCOPY;  Surgeon: Onita Elspeth Sharper, DO;  Location: [facility] ENDOSCOPY;  Service: Gastroenterology;  Laterality: N/A;   COLONOSCOPY WITH PROPOFOL  N/A 12/16/2014   Procedure: COLONOSCOPY WITH PROPOFOL ;  Surgeon: Donnice Vaughn Manes, MD;  Location: [facility] ENDOSCOPY;  Service: Endoscopy;  Laterality: N/A;   PELVIC LYMPH NODE DISSECTION Bilateral 04/27/2021   Procedure: PELVIC LYMPH NODE DISSECTION;  Surgeon: [name], MD;  Location: [facility] ORS;  Service: Urology;  Laterality: Bilateral;   POLYPECTOMY  08/16/2022   Procedure: POLYPECTOMY;  Surgeon: Onita Elspeth Sharper, DO;  Location: [facility] ENDOSCOPY;  Service: Gastroenterology;;   PROSTATE BIOPSY     ROBOT ASSISTED LAPAROSCOPIC RADICAL PROSTATECTOMY N/A 04/27/2021   Procedure: XI ROBOTIC ASSISTED LAPAROSCOPIC RADICAL PROSTATECTOMY;  Surgeon: [name], MD;  Location: [facility] ORS;  Service: Urology;  Laterality: N/A;    Home Medications:  Allergies as of 09/05/2023       Reactions   Tamsulosin  Hcl    Causes dizziness        Medication List        Accurate as of September 02, 2023  5:58 AM. If you have any questions, ask your nurse or doctor.          fenofibrate  145 MG tablet  Commonly known as: TRICOR  TAKE 1 TABLET BY MOUTH EVERY DAY   hydrochlorothiazide  12.5 MG tablet Commonly known as: HYDRODIURIL  Take 1 tablet (12.5 mg total) by mouth daily.   lisinopril  20 MG tablet Commonly known as: ZESTRIL  TAKE 1 TABLET BY MOUTH EVERY DAY   ONE-A-DAY MENS PO Take 1 tablet by mouth daily.   pravastatin  40 MG tablet Commonly known as: PRAVACHOL  TAKE 1 TABLET BY MOUTH EVERY DAY   sertraline  50 MG tablet Commonly known as: ZOLOFT  TAKE 1 TABLET BY MOUTH EVERY DAY    tadalafil  (PAH) 20 MG tablet Commonly known as: ADCIRCA  TAKE 1 TABLET BY MOUTH EVERY DAY AS NEEDED ERECTILE DYSFUNCTION   tadalafil  20 MG tablet Commonly known as: CIALIS  Take 1 tablet (20 mg total) by mouth daily as needed for erectile dysfunction.        Allergies:  Allergies  Allergen Reactions   Tamsulosin  Hcl     Causes dizziness    Family History: Family History  Problem Relation Age of Onset   Hypertension Mother    Cancer Father    Hypertension Father    Prostate cancer Neg Hx    Bladder Cancer Neg Hx    Kidney cancer Neg Hx     Social History:  reports that he quit smoking about 24 years ago. His smoking use included cigarettes. He started smoking about 25 years ago. He has a 0.5 pack-year smoking history. He has never been exposed to tobacco smoke. He has never used smokeless tobacco. He reports current alcohol use. He reports that he does not currently use drugs.   Physical Exam: There were no vitals taken for this visit.  Constitutional:  Well nourished. Alert and oriented, No acute distress. HEENT: [place] AT, moist mucus membranes.  Trachea midline, no masses. Cardiovascular: No clubbing, cyanosis, or edema. Respiratory: Normal respiratory effort, no increased work of breathing. GI: Abdomen is soft, non tender, non distended, no abdominal masses. Liver and spleen not palpable.  No hernias appreciated.  Stool sample for occult testing is not indicated.   GU: No CVA tenderness.  No bladder fullness or masses.  Patient with circumcised/uncircumcised phallus. ***Foreskin easily retracted***  Urethral meatus is patent.  No penile discharge. No penile lesions or rashes. Scrotum without lesions, cysts, rashes and/or edema.  Testicles are located scrotally bilaterally. No masses are appreciated in the testicles. Left and right epididymis are normal. Rectal: Patient with  normal sphincter tone. Anus and perineum without scarring or rashes. No rectal masses are appreciated.  Prostate is approximately *** grams, *** nodules are appreciated. Seminal vesicles are normal. Skin: No rashes, bruises or suspicious lesions. Lymph: No cervical or inguinal adenopathy. Neurologic: Grossly intact, no focal deficits, moving all 4 extremities. Psychiatric: Normal mood and affect.   Assessment & Plan:    1. Prostate cancer - PSA remains undetectable, indicating no recurrence of disease - Emphasized the importance of regular follow-up appointments and PSA checks every six months due to higher risk of recurrence. - Orders placed for PSA test in six months.  2. Erectile dysfunction - ***  No follow-ups on file.  I have reviewed the above documentation for accuracy and completeness, and I agree with the above.   CLOTILDA CORNWALL, PA-C  [facility] [address] [place], [place] [address] [phone]

## 2023-09-05 ENCOUNTER — Ambulatory Visit: Admitting: Urology

## 2023-09-05 DIAGNOSIS — C61 Malignant neoplasm of prostate: Secondary | ICD-10-CM

## 2023-09-05 DIAGNOSIS — N521 Erectile dysfunction due to diseases classified elsewhere: Secondary | ICD-10-CM

## 2023-09-06 ENCOUNTER — Telehealth: Payer: Self-pay

## 2023-09-06 ENCOUNTER — Other Ambulatory Visit: Payer: Self-pay | Admitting: Student

## 2023-09-06 DIAGNOSIS — F324 Major depressive disorder, single episode, in partial remission: Secondary | ICD-10-CM

## 2023-09-06 NOTE — Telephone Encounter (Signed)
 Copied from CRM [id number]. Topic: Appointments - Transfer of Care >> Sep 06, 2023  9:54 AM Carlatta H wrote: Pt is requesting to transfer FROM: Alejandro Winters Pt is requesting to transfer TO: Alejandro Winters Reason for requested transfer: Provider left It is the responsibility of the team the patient would like to transfer to (Dr. Rolan Winters) to reach out to the patient if for any reason this transfer is not acceptable.

## 2023-09-06 NOTE — Telephone Encounter (Unsigned)
 Copied from CRM [id number]. Topic: Clinical - Medication Refill >> Sep 06, 2023  9:51 AM Carlatta H wrote: Medication: sertraline  (ZOLOFT ) 50 MG tablet  Has the patient contacted their pharmacy? Yes (Agent: If no, request that the patient contact the pharmacy for the refill. If patient does not wish to contact the pharmacy document the reason why and proceed with request.) (Agent: If yes, when and what did the pharmacy advise?)  This is the patient's preferred pharmacy:  [facility] [phone] [address] [place], [place] - [address] [place] [address] Phone: [phone] Fax: [phone]  Is this the correct pharmacy for this prescription? Yes If no, delete pharmacy and type the correct one.   Has the prescription been filled recently? No  Is the patient out of the medication? Yes  Has the patient been seen for an appointment in the last year OR does the patient have an upcoming appointment? No  Can we respond through MyChart? Yes  Agent: Please be advised that Rx refills may take up to 3 business days. We ask that you follow-up with your pharmacy.

## 2023-09-06 NOTE — Telephone Encounter (Signed)
 Pls call pt to schedule TOC with DAN.

## 2023-09-08 ENCOUNTER — Other Ambulatory Visit: Payer: Self-pay

## 2023-09-08 MED ORDER — SERTRALINE HCL 50 MG PO TABS
50.0000 mg | ORAL_TABLET | Freq: Every day | ORAL | 0 refills | Status: DC
Start: 2023-09-08 — End: 2023-12-13

## 2023-09-08 NOTE — Telephone Encounter (Signed)
 Requested medication (s) are due for refill today - yes  Requested medication (s) are on the active medication list -yes  Future visit scheduled -yes  Last refill: 06/06/23 #90  Notes to clinic: Patient has TOC appointment  10/17/23- can this Rx be extended?   Requested Prescriptions  Pending Prescriptions Disp Refills   sertraline  (ZOLOFT ) 50 MG tablet 90 tablet 0    Sig: Take 1 tablet (50 mg total) by mouth daily.     Psychiatry:  Antidepressants - SSRI - sertraline  Passed - 09/08/2023 12:52 PM      Passed - AST in normal range and within 360 days    AST  Date Value Ref Range Status  04/19/2023 20 15 - 41 U/L Final         Passed - ALT in normal range and within 360 days    ALT  Date Value Ref Range Status  04/19/2023 24 0 - 44 U/L Final         Passed - Completed PHQ-2 or PHQ-9 in the last 360 days      Passed - Valid encounter within last 6 months    Recent Outpatient Visits           4 months ago Annual physical exam   [place] [facility] Joshua Cathryne JAYSON, MD       Future Appointments             In 1 week McGowan, Clotilda DELENA RIGGERS [facility] [place]               Requested Prescriptions  Pending Prescriptions Disp Refills   sertraline  (ZOLOFT ) 50 MG tablet 90 tablet 0    Sig: Take 1 tablet (50 mg total) by mouth daily.     Psychiatry:  Antidepressants - SSRI - sertraline  Passed - 09/08/2023 12:52 PM      Passed - AST in normal range and within 360 days    AST  Date Value Ref Range Status  04/19/2023 20 15 - 41 U/L Final         Passed - ALT in normal range and within 360 days    ALT  Date Value Ref Range Status  04/19/2023 24 0 - 44 U/L Final         Passed - Completed PHQ-2 or PHQ-9 in the last 360 days      Passed - Valid encounter within last 6 months    Recent Outpatient Visits           4 months ago Annual physical exam   [facility] Joshua Cathryne JAYSON, MD       Future Appointments             In 1 week McGowan, Clotilda DELENA RIGGERS [facility] [place]

## 2023-09-08 NOTE — Telephone Encounter (Signed)
 Please review medication refill request, patient is schedule for a transition of care appointment in August

## 2023-09-14 DIAGNOSIS — G4733 Obstructive sleep apnea (adult) (pediatric): Secondary | ICD-10-CM | POA: Diagnosis not present

## 2023-09-19 ENCOUNTER — Ambulatory Visit: Admitting: Urology

## 2023-10-09 NOTE — Progress Notes (Unsigned)
 10/10/2023 9:11 AM   Alejandro Winters 09-Apr-1960 [id number]  Referring provider: Joshua Cathryne BROCKS, MD No address on file  Urological history: 1. Unfavorable intermediate risk prostate cancer - PSA (08/2023) <0.01 - Robotic prostatectomy (04/2021)  2. ED - tadalafil  20 mg, on-demand-dosing - sildenafil  20 mg, on-demand-dosing  Chief Complaint  Patient presents with   Follow-up   Prostate Cancer   HPI: Alejandro Winters is a 63 y.o. man who presents today for one year follow up.    Previous records reviewed.   He is not having any incontinent issues.  Patient denies any modifying or aggravating factors.  Patient denies any recent UTI's, gross hematuria, dysuria or suprapubic/flank pain.  Patient denies any fevers, chills, nausea or vomiting.    He is taking tadalafil  20 mg daily, but he has not had satisfactory erections for intercourse.  Patient is not having spontaneous erections.  He denies any pain or curvature with erections.    PSA (08/2023) <0.01  Lab Results  Component Value Date   CREATININE 1.61 (H) 04/19/2023    PMH: Past Medical History:  Diagnosis Date   Cancer (HCC)    prostate   CHF (congestive heart failure) (HCC)    Depression    Hyperlipemia    Hypertension    Kidney failure    Sleep apnea    Vitiligo     Surgical History: Past Surgical History:  Procedure Laterality Date   COLONOSCOPY N/A 08/16/2022   Procedure: COLONOSCOPY;  Surgeon: Alejandro Elspeth Sharper, DO;  Location: [facility] ENDOSCOPY;  Service: Gastroenterology;  Laterality: N/A;   COLONOSCOPY WITH PROPOFOL  N/A 12/16/2014   Procedure: COLONOSCOPY WITH PROPOFOL ;  Surgeon: Alejandro Vaughn Manes, MD;  Location: [facility] ENDOSCOPY;  Service: Endoscopy;  Laterality: N/A;   PELVIC LYMPH NODE DISSECTION Bilateral 04/27/2021   Procedure: PELVIC LYMPH NODE DISSECTION;  Surgeon: Alejandro Knee, MD;  Location: [facility] ORS;  Service: Urology;  Laterality: Bilateral;   POLYPECTOMY  08/16/2022   Procedure:  POLYPECTOMY;  Surgeon: Alejandro Elspeth Sharper, DO;  Location: [facility] ENDOSCOPY;  Service: Gastroenterology;;   PROSTATE BIOPSY     ROBOT ASSISTED LAPAROSCOPIC RADICAL PROSTATECTOMY N/A 04/27/2021   Procedure: XI ROBOTIC ASSISTED LAPAROSCOPIC RADICAL PROSTATECTOMY;  Surgeon: Alejandro Knee, MD;  Location: [facility] ORS;  Service: Urology;  Laterality: N/A;    Home Medications:  Allergies as of 10/10/2023       Reactions   Tamsulosin  Hcl    Causes dizziness        Medication List        Accurate as of October 10, 2023  9:11 AM. If you have any questions, ask your nurse or doctor.          fenofibrate  145 MG tablet Commonly known as: TRICOR  TAKE 1 TABLET BY MOUTH EVERY DAY   hydrochlorothiazide  12.5 MG tablet Commonly known as: HYDRODIURIL  Take 1 tablet (12.5 mg total) by mouth daily.   lisinopril  20 MG tablet Commonly known as: ZESTRIL  TAKE 1 TABLET BY MOUTH EVERY DAY   ONE-A-DAY MENS PO Take 1 tablet by mouth daily.   pravastatin  40 MG tablet Commonly known as: PRAVACHOL  TAKE 1 TABLET BY MOUTH EVERY DAY   sertraline  50 MG tablet Commonly known as: ZOLOFT  Take 1 tablet (50 mg total) by mouth daily.   tadalafil  (PAH) 20 MG tablet Commonly known as: ADCIRCA  TAKE 1 TABLET BY MOUTH EVERY DAY AS NEEDED ERECTILE DYSFUNCTION   tadalafil  20 MG tablet Commonly known as: CIALIS  Take 1 tablet (20 mg total) by mouth  daily as needed for erectile dysfunction.        Allergies:  Allergies  Allergen Reactions   Tamsulosin  Hcl     Causes dizziness    Family History: Family History  Problem Relation Age of Onset   Hypertension Mother    Cancer Father    Hypertension Father    Prostate cancer Neg Hx    Bladder Cancer Neg Hx    Kidney cancer Neg Hx     Social History: See HPI for pertinent social history  ROS: Pertinent ROS in HPI  Physical Exam: BP (!) 149/84 (BP Location: Left Arm, Patient Position: Sitting, Cuff Size: Large)   Pulse 85   Ht 6' (1.829 m)    Wt 294 lb 9.6 oz (133.6 kg)   SpO2 95%   BMI 39.95 kg/m   Constitutional:  Well nourished. Alert and oriented, No acute distress. HEENT: [place] AT, moist mucus membranes.  Trachea midline Cardiovascular: No clubbing, cyanosis, or edema. Respiratory: Normal respiratory effort, no increased work of breathing. Neurologic: Grossly intact, no focal deficits, moving all 4 extremities. Psychiatric: Normal mood and affect.  Laboratory Data: See EPIC and HPI  I have reviewed the labs.   Pertinent Imaging: N/A   Assessment & Plan:    1. Unfavorable intermediate prostate cancer - PSA is undetectable - encouraged regular follow-up appointments and PSA checks q 6 months   2. ED - Explained that he is already on the maximum dose of tadalafil  taken at 20 mg daily - We discussed alternative treatments for erectile dysfunctions specifically ICI and IPP - He is somewhat interested in ICI, but he would like more time to see if he can improve his erections with weight loss and increased exercise - We will revisit this when he returns in 6 months  Return in about 6 months (around 04/11/2024) for PSA and office visit .  These notes generated with voice recognition software. I apologize for typographical errors.  Alejandro Winters  [facility] [address]  [address] [place], [place] [address] [phone]

## 2023-10-10 ENCOUNTER — Ambulatory Visit: Admitting: Urology

## 2023-10-10 ENCOUNTER — Encounter: Payer: Self-pay | Admitting: Urology

## 2023-10-10 VITALS — BP 149/84 | HR 85 | Ht 72.0 in | Wt 294.6 lb

## 2023-10-10 DIAGNOSIS — C61 Malignant neoplasm of prostate: Secondary | ICD-10-CM

## 2023-10-10 DIAGNOSIS — N521 Erectile dysfunction due to diseases classified elsewhere: Secondary | ICD-10-CM

## 2023-10-15 DIAGNOSIS — G4733 Obstructive sleep apnea (adult) (pediatric): Secondary | ICD-10-CM | POA: Diagnosis not present

## 2023-10-17 ENCOUNTER — Ambulatory Visit: Admitting: Physician Assistant

## 2023-10-17 VITALS — BP 148/88 | HR 80 | Temp 98.3°F | Ht 72.0 in | Wt 289.0 lb

## 2023-10-17 DIAGNOSIS — M25562 Pain in left knee: Secondary | ICD-10-CM | POA: Diagnosis not present

## 2023-10-17 DIAGNOSIS — I10 Essential (primary) hypertension: Secondary | ICD-10-CM | POA: Diagnosis not present

## 2023-10-17 DIAGNOSIS — G8929 Other chronic pain: Secondary | ICD-10-CM | POA: Insufficient documentation

## 2023-10-17 DIAGNOSIS — E8881 Metabolic syndrome: Secondary | ICD-10-CM | POA: Insufficient documentation

## 2023-10-17 DIAGNOSIS — G4733 Obstructive sleep apnea (adult) (pediatric): Secondary | ICD-10-CM

## 2023-10-17 MED ORDER — DICLOFENAC SODIUM 1 % EX GEL
2.0000 g | Freq: Four times a day (QID) | CUTANEOUS | 1 refills | Status: DC
Start: 2023-10-17 — End: 2023-12-13

## 2023-10-17 NOTE — Assessment & Plan Note (Signed)
 Elevated x 2 in clinic today.  Advised home BP monitoring this week, advised to restart HCTZ 12.5 mg daily if blood pressure consistently remains greater than 130/90.  Follow-up on this in a month with BP log for my review.

## 2023-10-17 NOTE — Assessment & Plan Note (Signed)
 Exam reassuring.  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  Discussed avoidance of oral NSAIDs in CKD 3.  Will try topical diclofenac  4 times daily as needed +/- acetaminophen .

## 2023-10-17 NOTE — Assessment & Plan Note (Signed)
 Discussed the importance of regular maintenance and cleaning for CPAP.  Could also implement sinus rinse at night or first thing in the morning to help with congestion and reduce risk of sinus infection.  Particular attention given to distilled water , which I emphasized is critical for sinus rinse; he verbalizes understanding and already uses distilled water  for his CPAP device, so understands its importance.

## 2023-10-17 NOTE — Progress Notes (Addendum)
 Date:  10/17/2023   Name:  Alejandro Winters   DOB:  [birth year]   MRN:  [id number]   Chief Complaint: Transitions Of Care, Knee Pain (Does not hurt when walking, sitting or going up steps, hurts when going down steps and when bending over picking up things ), and Hypertension (Has not been taking hydrochlorothiazide   since 04/2023, does not check BP at home, high reading in office today, pt stated he has the medication at home but thought he was not supposed to be taking it, pt stated he can start back taking it if needed )  Knee Pain  Incident onset: X1 years. There was no injury mechanism. The pain is present in the left knee. The quality of the pain is described as aching. The pain is at a severity of 6/10. The pain is mild. Pain course: comes and goes with movement. He reports no foreign bodies present. The symptoms are aggravated by movement and weight bearing. He has tried nothing for the symptoms.   [name] is a very pleasant 63 year old male with history of prostate cancer s/p robotic prostatectomy Feb 2023, ED, HTN, CKD3, HLD, obesity, and OSA who presents new to me today as transfer of care from my recently retired Animator Dr. Cathryne Molt.  PSA <0.01 since prostatectomy, last checked 09/01/2023 by urology.  He would like to focus on left knee pain today, as described above.  He has not tried anything for this including OTC medications, topicals, or compression sleeves.  Denies related knee injury to his knowledge.  Mentions a minor concern regarding his CPAP. States he is congested in the morning and worries if future sinus infection could complicate his periodontal disease.   Medication list has been reviewed and updated.  Current Meds  Medication Sig   diclofenac  Sodium (VOLTAREN ) 1 % GEL Apply 2 g topically 4 (four) times daily. Use on affected joint up to 4x/day as needed. 2 grams is roughly 4 fingertips' worth of gel.   fenofibrate  (TRICOR ) 145 MG tablet TAKE 1 TABLET BY MOUTH  EVERY DAY   lisinopril  (ZESTRIL ) 20 MG tablet TAKE 1 TABLET BY MOUTH EVERY DAY   Multiple Vitamin (ONE-A-DAY MENS PO) Take 1 tablet by mouth daily.   pravastatin  (PRAVACHOL ) 40 MG tablet TAKE 1 TABLET BY MOUTH EVERY DAY   Probiotic Product (PROBIOTIC PO) Take by mouth.   sertraline  (ZOLOFT ) 50 MG tablet Take 1 tablet (50 mg total) by mouth daily.   tadalafil  (CIALIS ) 20 MG tablet Take 1 tablet (20 mg total) by mouth daily as needed for erectile dysfunction.   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) 01/13/2021   Class 3 severe obesity with serious comorbidity in adult 05/07/2019   Mood disorder (HCC) 05/07/2019   Vitiligo 05/04/2019   Mixed hyperlipidemia 08/22/2017   Essential hypertension 09/24/2016   CKD (chronic kidney disease) stage 3, GFR 30-59 ml/min (HCC) 09/24/2016   OSA on CPAP 01/12/2010   Paroxysmal atrial fibrillation (HCC) 01/12/2010    Allergies  Allergen Reactions   Tamsulosin  Hcl     Causes dizziness    Immunization  History  Administered Date(s) Administered   Influenza, Seasonal, Injecte, Preservative Fre 01/13/2010, 11/19/2022   Influenza,inj,Quad PF,6+ Mos 11/02/2018, 01/13/2021, 04/19/2022   Moderna Sars-Covid-2 Vaccination 05/16/2019, 06/13/2019, 01/23/2020   Pneumococcal Polysaccharide-23 01/20/2010   Tdap 10/28/2010   Zoster Recombinant(Shingrix ) 10/18/2022    Past Surgical History:  Procedure Laterality Date   COLONOSCOPY N/A 08/16/2022   Procedure: COLONOSCOPY;  Surgeon: Onita Elspeth Sharper, DO;  Location: [facility] ENDOSCOPY;  Service: Gastroenterology;  Laterality: N/A;    COLONOSCOPY WITH PROPOFOL  N/A 12/16/2014   Procedure: COLONOSCOPY WITH PROPOFOL ;  Surgeon: Donnice Vaughn Manes, MD;  Location: [facility] ENDOSCOPY;  Service: Endoscopy;  Laterality: N/A;   PELVIC LYMPH NODE DISSECTION Bilateral 04/27/2021   Procedure: PELVIC LYMPH NODE DISSECTION;  Surgeon: [name], MD;  Location: [facility] ORS;  Service: Urology;  Laterality: Bilateral;   POLYPECTOMY  08/16/2022   Procedure: POLYPECTOMY;  Surgeon: Onita Elspeth Sharper, DO;  Location: [facility] ENDOSCOPY;  Service: Gastroenterology;;   PROSTATE BIOPSY     ROBOT ASSISTED LAPAROSCOPIC RADICAL PROSTATECTOMY N/A 04/27/2021   Procedure: XI ROBOTIC ASSISTED LAPAROSCOPIC RADICAL PROSTATECTOMY;  Surgeon: [name], MD;  Location: [facility] ORS;  Service: Urology;  Laterality: N/A;    Social History   Tobacco Use   Smoking status: Former    Current packs/day: 0.00    Average packs/day: 0.5 packs/day for 1 year (0.5 ttl pk-yrs)    Types: Cigarettes    Start date: 10/27/1997    Quit date: 10/28/1998    Years since quitting: 24.9    Passive exposure: Never   Smokeless tobacco: Never  Vaping Use   Vaping status: Never Used  Substance Use Topics   Alcohol use: Yes    Comment: rarley   Drug use: Not Currently    Comment: marijuana    Family History  Problem Relation Age of Onset   Hypertension Mother    Cancer Father    Hypertension Father    Prostate cancer Neg Hx    Bladder Cancer Neg Hx    Kidney cancer Neg Hx         10/17/2023    9:38 AM 04/19/2023   10:16 AM 11/19/2022   10:35 AM 10/18/2022   10:43 AM  GAD 7 : Generalized Anxiety Score  Nervous, Anxious, on Edge 0 0 0 0  Control/stop worrying 0 0 0 0  Worry too much - different things 0 0 0 0  Trouble relaxing 0 0 0 0  Restless 0 0 0 0  Easily annoyed or irritable 0 0 0 0  Afraid - awful might happen 0 0 0 0  Total GAD 7 Score 0 0 0 0  Anxiety Difficulty Not difficult at all Not difficult at all Not difficult at all Not difficult at all        10/17/2023    9:38 AM 04/19/2023   10:15 AM 11/19/2022   10:35 AM  Depression screen PHQ 2/9  Decreased Interest 0 0 0  Down, Depressed, Hopeless 0 0 0  PHQ - 2 Score 0 0 0  Altered sleeping 0 2 0  Tired, decreased energy 2 1 0  Change in appetite 0 0 0  Feeling bad or failure about yourself  0 0 0  Trouble concentrating 0 0 0  Moving slowly or fidgety/restless 0 0 0  Suicidal thoughts 0 0 0  PHQ-9 Score 2 3 0  Difficult doing work/chores Not difficult at all Not difficult at all Not difficult at all    BP Readings from Last  3 Encounters:  10/17/23 (!) 148/88  10/10/23 (!) 149/84  04/19/23 136/82    Wt Readings from Last 3 Encounters:  10/17/23 289 lb (131.1 kg)  10/10/23 294 lb 9.6 oz (133.6 kg)  04/19/23 299 lb (135.6 kg)    BP (!) 148/88 (Cuff Size: Large)   Pulse 80   Temp 98.3 F (36.8 C)   Ht 6' (1.829 m)   Wt 289 lb (131.1 kg)   SpO2 96%   BMI 39.20 kg/m   Physical Exam Vitals and nursing note reviewed.  Constitutional:      Appearance: Normal appearance.  Neck:     Vascular: No carotid bruit.  Cardiovascular:     Rate and Rhythm: Normal rate and regular rhythm.     Heart sounds: No murmur heard.    No friction rub. No gallop.  Pulmonary:     Effort: Pulmonary effort is normal.     Breath sounds: Normal breath sounds.  Abdominal:     General: There is no distension.  Musculoskeletal:        General: Normal range of motion.     Comments: Left knee with full ROM, mild crepitus, strength 5/5, no edema. Gait normal. Negative anterior/posterior drawer. No pain with varus/valgus stress.   Skin:    General: Skin is warm and dry.  Neurological:     Mental Status: He is alert and oriented to person, place, and time.     Gait: Gait is intact.  Psychiatric:        Mood and Affect: Mood and affect normal.     Recent Labs     Component Value Date/Time   NA 138 04/19/2023 1108   NA 142 10/16/2021 1207   K 3.8 04/19/2023 1108   CL 103 04/19/2023 1108    CO2 26 04/19/2023 1108   GLUCOSE 99 04/19/2023 1108   BUN 23 04/19/2023 1108   BUN 15 10/16/2021 1207   CREATININE 1.61 (H) 04/19/2023 1108   CALCIUM 9.9 04/19/2023 1108   PROT 7.8 04/19/2023 1108   ALBUMIN 4.6 04/19/2023 1108   ALBUMIN 4.9 10/16/2021 1207   AST 20 04/19/2023 1108   ALT 24 04/19/2023 1108   ALKPHOS 28 (L) 04/19/2023 1108   BILITOT 0.9 04/19/2023 1108   GFRNONAA 47 (L) 04/19/2023 1108   GFRAA 56 08/15/2020 0000    Lab Results  Component Value Date   WBC 5.6 11/19/2022   HGB 11.6 (L) 11/19/2022   HCT 33.9 (L) 11/19/2022   MCV 96.3 11/19/2022   PLT 262 11/19/2022   Lab Results  Component Value Date   HGBA1C 4.7 08/15/2020   HGBA1C 4.7 (L) 08/22/2017   Lab Results  Component Value Date   CHOL 147 04/19/2023   HDL 30 (L) 04/19/2023   LDLCALC 83 04/19/2023   TRIG 169 (H) 04/19/2023   CHOLHDL 4.9 04/19/2023   Lab Results  Component Value Date   TSH 2.05 08/15/2020      Assessment and Plan:  Chronic pain of left knee Assessment & Plan: Exam reassuring.  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  Discussed avoidance of oral NSAIDs in CKD 3.  Will try topical diclofenac  4 times daily as needed +/- acetaminophen .  Orders: -     Diclofenac  Sodium; Apply 2 g topically 4 (four) times daily. Use on affected joint up to 4x/day as needed. 2 grams is roughly 4 fingertips' worth of gel.  Dispense: 100 g; Refill: 1  Essential hypertension Assessment & Plan: Elevated x 2 in clinic today.  Advised home BP monitoring this week, advised to restart HCTZ 12.5 mg daily if blood pressure consistently remains greater than 130/90.  Follow-up on this in a month with BP log for my review.   OSA on CPAP Assessment & Plan: Discussed the importance of regular maintenance and cleaning for CPAP.  Could also implement sinus rinse at night or first thing in the morning to help with congestion and reduce risk of sinus infection.  Particular attention given to distilled water , which I  emphasized is critical for sinus rinse; he verbalizes understanding and already uses distilled water  for his CPAP device, so understands its importance.      Return in about 4 weeks (around 11/14/2023) for FASTING OV f/u chronic conditions.    Rolan Hoyle, PA-C, DMSc, Nutritionist [facility] [phone]

## 2023-11-14 ENCOUNTER — Ambulatory Visit: Admitting: Physician Assistant

## 2023-11-15 ENCOUNTER — Other Ambulatory Visit (HOSPITAL_COMMUNITY): Payer: Self-pay

## 2023-11-15 ENCOUNTER — Ambulatory Visit: Admitting: Physician Assistant

## 2023-11-15 ENCOUNTER — Encounter: Payer: Self-pay | Admitting: Physician Assistant

## 2023-11-15 ENCOUNTER — Telehealth: Payer: Self-pay | Admitting: Physician Assistant

## 2023-11-15 ENCOUNTER — Telehealth: Payer: Self-pay | Admitting: Pharmacy Technician

## 2023-11-15 VITALS — BP 142/96 | HR 69 | Temp 99.2°F | Ht 72.0 in | Wt 288.0 lb

## 2023-11-15 DIAGNOSIS — I10 Essential (primary) hypertension: Secondary | ICD-10-CM | POA: Diagnosis not present

## 2023-11-15 DIAGNOSIS — E66813 Obesity, class 3: Secondary | ICD-10-CM | POA: Diagnosis not present

## 2023-11-15 DIAGNOSIS — G4733 Obstructive sleep apnea (adult) (pediatric): Secondary | ICD-10-CM

## 2023-11-15 DIAGNOSIS — N183 Chronic kidney disease, stage 3 unspecified: Secondary | ICD-10-CM | POA: Diagnosis not present

## 2023-11-15 DIAGNOSIS — D531 Other megaloblastic anemias, not elsewhere classified: Secondary | ICD-10-CM | POA: Diagnosis not present

## 2023-11-15 MED ORDER — ZEPBOUND 2.5 MG/0.5ML ~~LOC~~ SOAJ
2.5000 mg | SUBCUTANEOUS | 0 refills | Status: DC
Start: 2023-11-15 — End: 2023-11-16

## 2023-11-15 NOTE — Telephone Encounter (Signed)
 PA request has been Started. New Encounter has been or will be created for follow up. For additional info see Pharmacy Prior Auth telephone encounter from 11/15/23.

## 2023-11-15 NOTE — Assessment & Plan Note (Signed)
 Begin Zepbound 

## 2023-11-15 NOTE — Progress Notes (Signed)
 Date:  11/15/2023   Name:  Alejandro Winters   DOB:  [birth year]   MRN:  [id number]   Chief Complaint: Medical Management of Chronic Issues (High BP readings at home )  HPI Orvel returns for 1 month follow up on HTN. Last visit we did not adjust his medications, and he said he had hydrochlorothiazide  at home which he had not been taking. He was advised to monitor BP at home and restart hydrochlorothiazide  if persistently elevated. He has been getting readings 130s-140s systolic but has not restarted hydrochlorothiazide .   He would also like to discuss weight management and is interested in pharmacotherapy. Weight has slowly been trending down over the last 12-18 months, says he is working on it. He has OSA on CPAP.    Medication list has been reviewed and updated.  Current Meds  Medication Sig   diclofenac  Sodium (VOLTAREN ) 1 % GEL Apply 2 g topically 4 (four) times daily. Use on affected joint up to 4x/day as needed. 2 grams is roughly 4 fingertips' worth of gel.   fenofibrate  (TRICOR ) 145 MG tablet TAKE 1 TABLET BY MOUTH EVERY DAY   hydrochlorothiazide  (HYDRODIURIL ) 12.5 MG tablet Take 1 tablet (12.5 mg total) by mouth daily.   lisinopril  (ZESTRIL ) 20 MG tablet TAKE 1 TABLET BY MOUTH EVERY DAY   Multiple Vitamin (ONE-A-DAY MENS PO) Take 1 tablet by mouth daily.   pravastatin  (PRAVACHOL ) 40 MG tablet TAKE 1 TABLET BY MOUTH EVERY DAY   Probiotic Product (PROBIOTIC PO) Take by mouth.   sertraline  (ZOLOFT ) 50 MG tablet Take 1 tablet (50 mg total) by mouth daily.   tadalafil  (CIALIS ) 20 MG tablet Take 1 tablet (20 mg total) by mouth daily as needed for erectile dysfunction.   tadalafil , PAH, (ADCIRCA ) 20 MG tablet TAKE 1 TABLET BY MOUTH EVERY DAY AS NEEDED ERECTILE DYSFUNCTION   UNABLE TO FIND Med Name: [place] to cleanse liver   ZEPBOUND  2.5 MG/0.5ML Pen Inject 2.5 mg into the skin once a week.     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) 01/13/2021   Class 3 severe obesity with serious comorbidity in adult 05/07/2019   Mood disorder (HCC) 05/07/2019   Vitiligo 05/04/2019   Mixed hyperlipidemia 08/22/2017   Essential hypertension 09/24/2016   CKD (chronic kidney disease) stage 3, GFR 30-59 ml/min (HCC) 09/24/2016   OSA on CPAP 01/12/2010   Paroxysmal atrial fibrillation (HCC) 01/12/2010    Allergies  Allergen Reactions   Tamsulosin  Hcl     Causes dizziness    Immunization History  Administered Date(s) Administered   Influenza, Seasonal, Injecte, Preservative Fre 01/13/2010, 11/19/2022   Influenza,inj,Quad PF,6+ Mos 11/02/2018, 01/13/2021, 04/19/2022   Moderna Sars-Covid-2 Vaccination 05/16/2019, 06/13/2019, 01/23/2020   Pneumococcal Polysaccharide-23 01/20/2010   Tdap 10/28/2010   Zoster Recombinant(Shingrix ) 10/18/2022    Past Surgical History:  Procedure Laterality Date   COLONOSCOPY N/A 08/16/2022   Procedure: COLONOSCOPY;  Surgeon: Onita Elspeth Sharper, DO;  Location: [facility] ENDOSCOPY;  Service: Gastroenterology;  Laterality: N/A;   COLONOSCOPY WITH PROPOFOL  N/A 12/16/2014   Procedure: COLONOSCOPY WITH PROPOFOL ;  Surgeon: Donnice Vaughn Manes, MD;  Location: [facility] ENDOSCOPY;  Service: Endoscopy;  Laterality: N/A;   PELVIC LYMPH NODE DISSECTION Bilateral 04/27/2021   Procedure: PELVIC LYMPH NODE DISSECTION;  Surgeon: [name],  MD;  Location: [facility] ORS;  Service: Urology;  Laterality: Bilateral;   POLYPECTOMY  08/16/2022   Procedure: POLYPECTOMY;  Surgeon: Onita Elspeth Sharper, DO;  Location: [facility] ENDOSCOPY;  Service: Gastroenterology;;   PROSTATE BIOPSY     ROBOT ASSISTED LAPAROSCOPIC RADICAL PROSTATECTOMY N/A 04/27/2021   Procedure: XI ROBOTIC ASSISTED LAPAROSCOPIC RADICAL PROSTATECTOMY;  Surgeon: [name], MD;  Location: [facility]  ORS;  Service: Urology;  Laterality: N/A;    Social History   Tobacco Use   Smoking status: Former    Current packs/day: 0.00    Average packs/day: 0.5 packs/day for 1 year (0.5 ttl pk-yrs)    Types: Cigarettes    Start date: 10/27/1997    Quit date: 10/28/1998    Years since quitting: 25.0    Passive exposure: Never   Smokeless tobacco: Never  Vaping Use   Vaping status: Never Used  Substance Use Topics   Alcohol use: Yes    Comment: rarley   Drug use: Not Currently    Comment: marijuana    Family History  Problem Relation Age of Onset   Hypertension Mother    Cancer Father    Hypertension Father    Prostate cancer Neg Hx    Bladder Cancer Neg Hx    Kidney cancer Neg Hx         11/15/2023    9:06 AM 10/17/2023    9:38 AM 04/19/2023   10:16 AM 11/19/2022   10:35 AM  GAD 7 : Generalized Anxiety Score  Nervous, Anxious, on Edge 0 0 0 0  Control/stop worrying 0 0 0 0  Worry too much - different things 0 0 0 0  Trouble relaxing 0 0 0 0  Restless 0 0 0 0  Easily annoyed or irritable 0 0 0 0  Afraid - awful might happen 0 0 0 0  Total GAD 7 Score 0 0 0 0  Anxiety Difficulty Not difficult at all Not difficult at all Not difficult at all Not difficult at all       11/15/2023    9:06 AM 10/17/2023    9:38 AM 04/19/2023   10:15 AM  Depression screen PHQ 2/9  Decreased Interest 0 0 0  Down, Depressed, Hopeless 0 0 0  PHQ - 2 Score 0 0 0  Altered sleeping  0 2  Tired, decreased energy  2 1  Change in appetite  0 0  Feeling bad or failure about yourself   0 0  Trouble concentrating  0 0  Moving slowly or fidgety/restless  0 0  Suicidal thoughts  0 0  PHQ-9 Score  2 3  Difficult doing work/chores  Not difficult at all Not difficult at all    BP Readings from Last 3 Encounters:  11/15/23 (!) 142/96  10/17/23 (!) 148/88  10/10/23 (!) 149/84    Wt Readings from Last 3 Encounters:  11/15/23 288 lb (130.6 kg)  10/17/23 289 lb (131.1 kg)  10/10/23 294 lb 9.6 oz  (133.6 kg)    BP (!) 142/96 (Cuff Size: Large)   Pulse 69   Temp 99.2 F (37.3 C)   Ht 6' (1.829 m)   Wt 288 lb (130.6 kg)   SpO2 97%   BMI 39.06 kg/m   Physical Exam Vitals and nursing note reviewed.  Constitutional:      Appearance: Normal appearance. He is obese.  Cardiovascular:     Rate and Rhythm: Normal rate.  Pulmonary:     Effort: Pulmonary effort is  normal.  Abdominal:     General: There is no distension.  Musculoskeletal:        General: Normal range of motion.  Skin:    General: Skin is warm and dry.  Neurological:     Mental Status: He is alert and oriented to person, place, and time.     Gait: Gait is intact.  Psychiatric:        Mood and Affect: Mood and affect normal.     Recent Labs     Component Value Date/Time   NA 138 04/19/2023 1108   NA 142 10/16/2021 1207   K 3.8 04/19/2023 1108   CL 103 04/19/2023 1108   CO2 26 04/19/2023 1108   GLUCOSE 99 04/19/2023 1108   BUN 23 04/19/2023 1108   BUN 15 10/16/2021 1207   CREATININE 1.61 (H) 04/19/2023 1108   CALCIUM 9.9 04/19/2023 1108   PROT 7.8 04/19/2023 1108   ALBUMIN 4.6 04/19/2023 1108   ALBUMIN 4.9 10/16/2021 1207   AST 20 04/19/2023 1108   ALT 24 04/19/2023 1108   ALKPHOS 28 (L) 04/19/2023 1108   BILITOT 0.9 04/19/2023 1108   GFRNONAA 47 (L) 04/19/2023 1108   GFRAA 56 08/15/2020 0000    Lab Results  Component Value Date   WBC 5.6 11/19/2022   HGB 11.6 (L) 11/19/2022   HCT 33.9 (L) 11/19/2022   MCV 96.3 11/19/2022   PLT 262 11/19/2022   Lab Results  Component Value Date   HGBA1C 4.7 08/15/2020   HGBA1C 4.7 (L) 08/22/2017   Lab Results  Component Value Date   CHOL 147 04/19/2023   HDL 30 (L) 04/19/2023   LDLCALC 83 04/19/2023   TRIG 169 (H) 04/19/2023   CHOLHDL 4.9 04/19/2023   Lab Results  Component Value Date   TSH 2.05 08/15/2020      Assessment and Plan:  Essential hypertension Assessment & Plan: Restart hydrochlorothiazide  which he has at home. Continue home  BP monitoring. Bring log next time.  Orders: -     Amb ref to Medical Nutrition Therapy-MNT  Class 3 severe obesity with serious comorbidity in adult Assessment & Plan: Will begin prior auth for Zepbound . No personal or fam hx thyroid cancer or MEN2. No hx pancreatitis or gallstones. We discussed MOA and common side effects. Demonstrated use of pen during our visit today.   Advised the importance of adequate protein intake on this medication as well as resistance training which will not only improve insulin sensitivity but also build muscle mass and reduce risk of atrophy.  I recommended minimum 20 min sessions 3x/wk. Advised importance of portion control and slower eating on this medication to minimize GI side effects. Avoid high fat foods as these may cause discomfort. Patient aware of potential for weight regain when eventually stopping the medication. For this reason, continued efforts toward improving lifestyle will be particularly important moving forward.   Also sending to registered dietician for education.    Orders: -     CBC with Differential/Platelet -     Comprehensive metabolic panel with GFR -     TSH -     Lipid panel -     Hemoglobin A1c -     Zepbound ; Inject 2.5 mg into the skin once a week.  Dispense: 2 mL; Refill: 0 -     Amb ref to Medical Nutrition Therapy-MNT  OSA on CPAP Assessment & Plan: Begin Zepbound   Orders: -     Zepbound ; Inject 2.5 mg into  the skin once a week.  Dispense: 2 mL; Refill: 0  Stage 3 chronic kidney disease, unspecified whether stage 3a or 3b CKD (HCC) -     Amb ref to Medical Nutrition Therapy-MNT     Plan for f/u 4wk after Zepbound  start, if able to obtain.    Rolan Hoyle, PA-C, DMSc, Nutritionist [facility] [phone]

## 2023-11-15 NOTE — Telephone Encounter (Signed)
 Please initiate prior auth for Zepbound  for class 3 obesity with serious comorbidities including HTN, HLD, and OSA

## 2023-11-15 NOTE — Assessment & Plan Note (Signed)
 Restart hydrochlorothiazide  which he has at home. Continue home BP monitoring. Bring log next time.

## 2023-11-15 NOTE — Assessment & Plan Note (Addendum)
 Will begin prior auth for Zepbound . No personal or fam hx thyroid cancer or MEN2. No hx pancreatitis or gallstones. We discussed MOA and common side effects. Demonstrated use of pen during our visit today.   Advised the importance of adequate protein intake on this medication as well as resistance training which will not only improve insulin sensitivity but also build muscle mass and reduce risk of atrophy.  I recommended minimum 20 min sessions 3x/wk. Advised importance of portion control and slower eating on this medication to minimize GI side effects. Avoid high fat foods as these may cause discomfort. Patient aware of potential for weight regain when eventually stopping the medication. For this reason, continued efforts toward improving lifestyle will be particularly important moving forward.   Also sending to registered dietician for education.

## 2023-11-15 NOTE — Telephone Encounter (Signed)
 Pharmacy Patient Advocate Encounter   Received notification from Pt Calls Messages that prior authorization for Zepbound  2.5MG /0.5ML pen-injectors  is required/requested.   Insurance verification completed.   The patient is insured through [employer] .   Per test claim: PA required; PA started via CoverMyMeds. KEY [id number] . Waiting for clinical questions to populate.

## 2023-11-16 ENCOUNTER — Telehealth: Payer: Self-pay | Admitting: Physician Assistant

## 2023-11-16 ENCOUNTER — Ambulatory Visit: Payer: Self-pay | Admitting: Physician Assistant

## 2023-11-16 DIAGNOSIS — E66813 Obesity, class 3: Secondary | ICD-10-CM

## 2023-11-16 LAB — CBC WITH DIFFERENTIAL/PLATELET
Basophils Absolute: 0 x10E3/uL (ref 0.0–0.2)
Basos: 1 %
EOS (ABSOLUTE): 0.1 x10E3/uL (ref 0.0–0.4)
Eos: 1 %
Hematocrit: 39.4 % (ref 37.5–51.0)
Hemoglobin: 12.7 g/dL — ABNORMAL LOW (ref 13.0–17.7)
Immature Grans (Abs): 0 x10E3/uL (ref 0.0–0.1)
Immature Granulocytes: 0 %
Lymphocytes Absolute: 1.1 x10E3/uL (ref 0.7–3.1)
Lymphs: 25 %
MCH: 32.6 pg (ref 26.6–33.0)
MCHC: 32.2 g/dL (ref 31.5–35.7)
MCV: 101 fL — ABNORMAL HIGH (ref 79–97)
Monocytes Absolute: 0.4 x10E3/uL (ref 0.1–0.9)
Monocytes: 10 %
Neutrophils Absolute: 2.7 x10E3/uL (ref 1.4–7.0)
Neutrophils: 63 %
Platelets: 278 x10E3/uL (ref 150–450)
RBC: 3.9 x10E6/uL — ABNORMAL LOW (ref 4.14–5.80)
RDW: 11.3 % — ABNORMAL LOW (ref 11.6–15.4)
WBC: 4.3 x10E3/uL (ref 3.4–10.8)

## 2023-11-16 LAB — COMPREHENSIVE METABOLIC PANEL WITH GFR
ALT: 18 IU/L (ref 0–44)
AST: 20 IU/L (ref 0–40)
Albumin: 4.6 g/dL (ref 3.9–4.9)
Alkaline Phosphatase: 44 IU/L — ABNORMAL LOW (ref 47–123)
BUN/Creatinine Ratio: 9 — ABNORMAL LOW (ref 10–24)
BUN: 13 mg/dL (ref 8–27)
Bilirubin Total: 0.5 mg/dL (ref 0.0–1.2)
CO2: 23 mmol/L (ref 20–29)
Calcium: 10.6 mg/dL — ABNORMAL HIGH (ref 8.6–10.2)
Chloride: 106 mmol/L (ref 96–106)
Creatinine, Ser: 1.52 mg/dL — ABNORMAL HIGH (ref 0.76–1.27)
Globulin, Total: 2.6 g/dL (ref 1.5–4.5)
Glucose: 96 mg/dL (ref 70–99)
Potassium: 4.2 mmol/L (ref 3.5–5.2)
Sodium: 143 mmol/L (ref 134–144)
Total Protein: 7.2 g/dL (ref 6.0–8.5)
eGFR: 51 mL/min/1.73 — ABNORMAL LOW (ref >59)

## 2023-11-16 LAB — TSH: TSH: 2.25 u[IU]/mL (ref 0.450–4.500)

## 2023-11-16 LAB — LIPID PANEL
Chol/HDL Ratio: 4.8 ratio (ref 0.0–5.0)
Cholesterol, Total: 163 mg/dL (ref 100–199)
HDL: 34 mg/dL — ABNORMAL LOW (ref >39)
LDL Chol Calc (NIH): 107 mg/dL — ABNORMAL HIGH (ref 0–99)
Triglycerides: 119 mg/dL (ref 0–149)
VLDL Cholesterol Cal: 22 mg/dL (ref 5–40)

## 2023-11-16 LAB — HEMOGLOBIN A1C
Est. average glucose Bld gHb Est-mCnc: 94 mg/dL
Hgb A1c MFr Bld: 4.9 % (ref 4.8–5.6)

## 2023-11-16 MED ORDER — WEGOVY 0.25 MG/0.5ML ~~LOC~~ SOAJ: 0.2500 mg | SUBCUTANEOUS | 0 refills | Status: DC

## 2023-11-16 NOTE — Telephone Encounter (Signed)
 Please initiate PA for Wegovy  for class 3 obesity with serious comorbidity (HTN, HLD, OSA).

## 2023-11-16 NOTE — Telephone Encounter (Signed)
 Please review.  KP

## 2023-11-16 NOTE — Telephone Encounter (Signed)
 Pharmacy Patient Advocate Encounter  Received notification from [employer] that Prior Authorization for Zepbound  2.5MG /0.5ML pen-injectors  has been DENIED.  Full denial letter will be uploaded to the media tab. See denial reason below.     PA #/Case ID/Reference #: [id number]

## 2023-11-17 ENCOUNTER — Other Ambulatory Visit: Payer: Self-pay | Admitting: Physician Assistant

## 2023-11-17 ENCOUNTER — Telehealth: Payer: Self-pay | Admitting: Pharmacy Technician

## 2023-11-17 ENCOUNTER — Other Ambulatory Visit (HOSPITAL_COMMUNITY): Payer: Self-pay

## 2023-11-17 DIAGNOSIS — E782 Mixed hyperlipidemia: Secondary | ICD-10-CM

## 2023-11-17 LAB — B12 AND FOLATE PANEL
Folate: >20 ng/mL (ref >3.0)
Vitamin B-12: 735 pg/mL (ref 232–1245)

## 2023-11-17 LAB — SPECIMEN STATUS REPORT

## 2023-11-17 MED ORDER — FENOFIBRATE 145 MG PO TABS: 145.0000 mg | ORAL_TABLET | Freq: Every day | ORAL | 1 refills | Status: AC

## 2023-11-17 NOTE — Telephone Encounter (Signed)
 Pharmacy Patient Advocate Encounter   Received notification from Pt Calls Messages that prior authorization for Wegovy  0.25MG /0.5ML auto-injectors  is required/requested.   Insurance verification completed.   The patient is insured through [employer] .   Per test claim: PA required; PA started via CoverMyMeds. KEY [id number] . Waiting for clinical questions to populate.

## 2023-11-17 NOTE — Telephone Encounter (Signed)
 Pharmacy Patient Advocate Encounter  Received notification from [employer] that Prior Authorization for Wegovy  0.25MG /0.5ML auto-injectors  has been DENIED.  No reason given; No denial letter received via Fax or CMM. It has been requested and will be uploaded to the media tab once received.     PA #/Case ID/Reference #: [id number]

## 2023-12-11 ENCOUNTER — Other Ambulatory Visit: Payer: Self-pay | Admitting: Physician Assistant

## 2023-12-11 DIAGNOSIS — G8929 Other chronic pain: Secondary | ICD-10-CM

## 2023-12-11 DIAGNOSIS — F324 Major depressive disorder, single episode, in partial remission: Secondary | ICD-10-CM

## 2023-12-13 NOTE — Telephone Encounter (Signed)
 Requested Prescriptions  Pending Prescriptions Disp Refills   sertraline  (ZOLOFT ) 50 MG tablet [Pharmacy Med Name: SERTRALINE  HCL 50 MG TABLET] 90 tablet 1    Sig: TAKE 1 TABLET BY MOUTH EVERY DAY     Psychiatry:  Antidepressants - SSRI - sertraline  Passed - 12/13/2023  2:19 PM      Passed - AST in normal range and within 360 days    AST  Date Value Ref Range Status  11/15/2023 20 0 - 40 IU/L Final         Passed - ALT in normal range and within 360 days    ALT  Date Value Ref Range Status  11/15/2023 18 0 - 44 IU/L Final         Passed - Completed PHQ-2 or PHQ-9 in the last 360 days      Passed - Valid encounter within last 6 months    Recent Outpatient Visits           4 weeks ago Essential hypertension   [place] [facility] at [facility], Toribio SQUIBB, [place]   1 month ago Chronic pain of left knee   [facility] at [facility], Toribio SQU[place]B, [place]   7 months ago Annual physical exam   [facility] at [facility] Cathryne JAYSON, [place]       Future Appointments             In 4 months McGowan, Clotilda DELENA RIGGERS [facility] [place]             diclofenac  Sodium (VOLTAREN ) 1 % GEL [Pharmacy Med Name: DICLOFENAC  SODIUM 1% GEL] 100 g 1    Sig: APPLY 2 G TOPICALLY UPTO 4 TIMES DAILY. USE ON AFFECTED JOINT AS NEEDED. 2 GRAMS IS ROUGHLY 4 FINGERTIPS' WORTH OF GEL.     Analgesics:  Topicals Failed - 12/13/2023  2:19 PM      Failed - Manual Review: Labs are only required if the patient has taken medication for more than 8 weeks.      Failed - HGB in normal range and within 360 days    Hemoglobin  Date Value Ref Range Status  11/15/2023 12.7 (L) 13.0 - 17.7 g/dL Final         Failed - Cr in normal range and within 360 days    Creatinine, Ser  Date Value Ref Range Status  11/15/2023 1.52 (H) 0.76 - 1.27 mg/dL Final         Passed - PLT in normal range and within  360 days    Platelets  Date Value Ref Range Status  11/15/2023 278 150 - 450 x10E3/uL Final         Passed - HCT in normal range and within 360 days    Hematocrit  Date Value Ref Range Status  11/15/2023 39.4 37.5 - 51.0 % Final         Passed - eGFR is 30 or above and within 360 days    GFR calc Af Amer  Date Value Ref Range Status  08/15/2020 56  Final   GFR, Estimated  Date Value Ref Range Status  04/19/2023 47 (L) >60 mL/min Final    Comment:    (NOTE) Calculated using the CKD-EPI Creatinine Equation (2021)    eGFR  Date Value Ref Range Status  11/15/2023 51 (L) >59 mL/min/1.73 Final  Passed - Patient is not pregnant      Passed - Valid encounter within last 12 months    Recent Outpatient Visits           4 weeks ago Essential hypertension   [facility] at [facility], Toribio SQUIBB, [place]   1 month ago Chronic pain of left knee   [facility] at [facility], Toribio SQU[place]B, [place]   7 months ago Annual physical exam   [facility] at [facility], [place]       Future Appointments             In 4 months McGowan, Clotilda DELENA RIGGERS [facility] [place]

## 2023-12-15 NOTE — Telephone Encounter (Signed)
 Please review.  KP

## 2023-12-19 NOTE — Progress Notes (Deleted)
 Medical Nutrition Therapy  Appointment Start time:  ***  Appointment End time:  ***  Primary concerns today: ***  Referral diagnosis: Stage 3 chronic kidney disease, unspecified whether stage 3a or 3b CKD, Essential HTN, class 3 obesity  Preferred learning style: *** (auditory, visual, hands on, no preference indicated) Learning readiness: *** (not ready, contemplating, ready, change in progress)   NUTRITION ASSESSMENT    Clinical Medical Hx: *** Medications: *** Labs: *** Notable Signs/Symptoms: ***  Lifestyle & Dietary Hx ***  Estimated daily fluid intake: *** oz Supplements: *** Sleep: *** Stress / self-care: *** Current average weekly physical activity: ***  24-Hr Dietary Recall First Meal: *** Snack: *** Second Meal: *** Snack: *** Third Meal: *** Snack: *** Beverages: ***  Estimated Energy Needs Calories: *** Carbohydrate: ***g Protein: ***g Fat: ***g   NUTRITION DIAGNOSIS  {CHL AMB NUTRITIONAL DIAGNOSIS:[id number]}   NUTRITION INTERVENTION  Nutrition education (E-1) on the following topics:  ***  Handouts Provided Include  Dish up Kidney friendly meal-NKD Move Your Ways-DHHS Low sodium seasoning-NCM  Learning Style & Readiness for Change Teaching method utilized: Visual & Auditory  Demonstrated degree of understanding via: Teach Back  Barriers to learning/adherence to lifestyle change: ***  Goals Established by Pt ***   MONITORING & EVALUATION Dietary intake, weekly physical activity, and *** in ***.  Next Steps  Patient is to ***.

## 2023-12-26 ENCOUNTER — Ambulatory Visit: Admitting: Dietician

## 2023-12-26 DIAGNOSIS — N183 Chronic kidney disease, stage 3 unspecified: Secondary | ICD-10-CM

## 2023-12-26 DIAGNOSIS — E66813 Obesity, class 3: Secondary | ICD-10-CM

## 2023-12-26 DIAGNOSIS — I10 Essential (primary) hypertension: Secondary | ICD-10-CM

## 2024-01-15 DIAGNOSIS — G4733 Obstructive sleep apnea (adult) (pediatric): Secondary | ICD-10-CM | POA: Diagnosis not present

## 2024-01-16 NOTE — Progress Notes (Deleted)
 Medical Nutrition Therapy  Appointment Start time:  ***  Appointment End time:  ***  Primary concerns today: ***  Referral diagnosis: Stage 3 chronic kidney disease, unspecified whether stage 3a or 3b CKD, Essential HTN, class 3 obesity  Preferred learning style: *** (auditory, visual, hands on, no preference indicated) Learning readiness: *** (not ready, contemplating, ready, change in progress)   NUTRITION ASSESSMENT    Clinical Medical Hx: *** Medications: *** Labs: *** Notable Signs/Symptoms: ***  Lifestyle & Dietary Hx ***  Estimated daily fluid intake: *** oz Supplements: *** Sleep: *** Stress / self-care: *** Current average weekly physical activity: ***  24-Hr Dietary Recall First Meal: *** Snack: *** Second Meal: *** Snack: *** Third Meal: *** Snack: *** Beverages: ***  Estimated Energy Needs Calories: *** Carbohydrate: ***g Protein: ***g Fat: ***g   NUTRITION DIAGNOSIS  {CHL AMB NUTRITIONAL DIAGNOSIS:[id number]}   NUTRITION INTERVENTION  Nutrition education (E-1) on the following topics:  ***  Handouts Provided Include  Dish up Kidney friendly meal-NKD Move Your Ways-DHHS Low sodium seasoning-NCM  Learning Style & Readiness for Change Teaching method utilized: Visual & Auditory  Demonstrated degree of understanding via: Teach Back  Barriers to learning/adherence to lifestyle change: ***  Goals Established by Pt ***   MONITORING & EVALUATION Dietary intake, weekly physical activity, and *** in ***.  Next Steps  Patient is to ***.

## 2024-01-23 ENCOUNTER — Encounter: Admitting: Dietician

## 2024-01-23 DIAGNOSIS — I10 Essential (primary) hypertension: Secondary | ICD-10-CM

## 2024-01-23 DIAGNOSIS — E66813 Obesity, class 3: Secondary | ICD-10-CM

## 2024-01-23 DIAGNOSIS — N183 Chronic kidney disease, stage 3 unspecified: Secondary | ICD-10-CM

## 2024-02-14 DIAGNOSIS — G4733 Obstructive sleep apnea (adult) (pediatric): Secondary | ICD-10-CM | POA: Diagnosis not present

## 2024-02-16 ENCOUNTER — Encounter: Payer: Self-pay | Admitting: Urology

## 2024-03-19 NOTE — Telephone Encounter (Signed)
 Please review.  KP

## 2024-03-20 ENCOUNTER — Other Ambulatory Visit: Payer: Self-pay | Admitting: Physician Assistant

## 2024-03-20 DIAGNOSIS — G4733 Obstructive sleep apnea (adult) (pediatric): Secondary | ICD-10-CM

## 2024-03-29 ENCOUNTER — Other Ambulatory Visit (HOSPITAL_COMMUNITY): Payer: Self-pay

## 2024-03-29 ENCOUNTER — Ambulatory Visit: Admitting: Physician Assistant

## 2024-03-29 ENCOUNTER — Telehealth: Payer: Self-pay

## 2024-03-29 ENCOUNTER — Other Ambulatory Visit: Payer: Self-pay | Admitting: Physician Assistant

## 2024-03-29 ENCOUNTER — Telehealth: Payer: Self-pay | Admitting: Physician Assistant

## 2024-03-29 ENCOUNTER — Telehealth: Payer: Self-pay | Admitting: Pharmacy Technician

## 2024-03-29 VITALS — BP 146/98 | HR 71 | Temp 98.5°F | Ht 72.0 in | Wt 289.6 lb

## 2024-03-29 DIAGNOSIS — I10 Essential (primary) hypertension: Secondary | ICD-10-CM | POA: Diagnosis not present

## 2024-03-29 DIAGNOSIS — I5032 Chronic diastolic (congestive) heart failure: Secondary | ICD-10-CM

## 2024-03-29 DIAGNOSIS — Z23 Encounter for immunization: Secondary | ICD-10-CM | POA: Diagnosis not present

## 2024-03-29 DIAGNOSIS — E782 Mixed hyperlipidemia: Secondary | ICD-10-CM | POA: Diagnosis not present

## 2024-03-29 DIAGNOSIS — Z0000 Encounter for general adult medical examination without abnormal findings: Secondary | ICD-10-CM | POA: Diagnosis not present

## 2024-03-29 DIAGNOSIS — G4733 Obstructive sleep apnea (adult) (pediatric): Secondary | ICD-10-CM

## 2024-03-29 MED ORDER — ZEPBOUND 2.5 MG/0.5ML ~~LOC~~ SOAJ: 2.5000 mg | SUBCUTANEOUS | 0 refills | Status: DC

## 2024-03-29 MED ORDER — HYDROCHLOROTHIAZIDE 12.5 MG PO TABS: 12.5000 mg | ORAL_TABLET | Freq: Every day | ORAL | 1 refills | Status: AC

## 2024-03-29 MED ORDER — LISINOPRIL 20 MG PO TABS: 20.0000 mg | ORAL_TABLET | Freq: Every day | ORAL | 1 refills | Status: AC

## 2024-03-29 MED ORDER — PRAVASTATIN SODIUM 40 MG PO TABS: 40.0000 mg | ORAL_TABLET | Freq: Every day | ORAL | 1 refills | Status: AC

## 2024-03-29 NOTE — Patient Instructions (Addendum)
 Alejandro Winters,  Thank you for taking the time for your Medicare Wellness Visit. I appreciate your continued commitment to your health goals. Please review the care plan we discussed, and feel free to reach out if I can assist you further.  Please note that Annual Wellness Visits do not include a physical exam. Some assessments may be limited, especially if the visit was conducted virtually. If needed, we may recommend an in-Fatina Sprankle follow-up with your provider.  Ongoing Care Seeing your primary care provider every 3 to 6 months helps us  monitor your health and provide consistent, personalized care. Rolan Hoyle, PA-C  Referrals If a referral was made during today's visit and you haven't received any updates within two weeks, please contact the referred provider directly to check on the status.  Recommended Screenings:  Health Maintenance  Topic Date Due   HIV Screening  Never done   Pneumococcal Vaccine for age over 50 (2 of 2 - PCV) 01/21/2011   Zoster (Shingles) Vaccine (2 of 2) 12/13/2022   COVID-19 Vaccine (4 - 2025-26 season) 10/31/2023   Flu Shot  05/29/2024*   Medicare Annual Wellness Visit  03/29/2025   Colon Cancer Screening  08/15/2032   Hepatitis B Vaccine  Aged Out   Meningitis B Vaccine  Aged Out   DTaP/Tdap/Td vaccine  Discontinued   Hepatitis C Screening  Discontinued  *Topic was postponed. The date shown is not the original due date.       03/29/2024   10:04 AM  Advanced Directives  Does Patient Have a Medical Advance Directive? No    Vision: Annual vision screenings are recommended for early detection of glaucoma, cataracts, and diabetic retinopathy. These exams can also reveal signs of chronic conditions such as diabetes and high blood pressure.  Dental: Annual dental screenings help detect early signs of oral cancer, gum disease, and other conditions linked to overall health, including heart disease and diabetes.  Please see the attached documents for additional  preventive care recommendations. Please get your shingles vaccine done at the pharmacy.

## 2024-03-29 NOTE — Telephone Encounter (Signed)
 Please check on prior auth for Zepbound  in this patient with Morbid obesity with comorbidities of HTN and OSA on CPAP. Sleep study (media tab) from 2010 shows severe OSA with AHI 75.

## 2024-03-29 NOTE — Telephone Encounter (Signed)
 PA request has been Received. New Encounter has been or will be created for follow up. For additional info see Pharmacy Prior Auth telephone encounter from 03/29/24.

## 2024-03-29 NOTE — Progress Notes (Addendum)
 Urgent   Chief Complaint  Patient presents with   Annual Wellness     Subjective:   Alejandro Winters is a 64 y.o. male who presents for a Welcome to Medicare Exam.   Feels to be doing well overall. Hx paroxysmal A-fib but no palpitations or chest pain recently. Has a stationary bike that he is trying to get back to using.   Ran out of lisinopril  and hydrochlorothiazide  1-2 weeks ago and has not refilled.   Labs in Sept 2025 reviewed and stable.   Visit info / Clinical Intake: Medicare Wellness Visit Type:: Welcome to [employer] (IPPE) Persons participating in visit and providing information:: patient Medicare Wellness Visit Mode:: In-person (required for WTM) Interpreter Needed?: No Pre-visit prep was completed: yes AWV questionnaire completed by patient prior to visit?: yes Living arrangements:: (Patient-Rptd) lives with spouse/significant other Patient's Overall Health Status Rating: (Patient-Rptd) good Typical amount of pain: (Patient-Rptd) some Does pain affect daily life?: (Patient-Rptd) no Are you currently prescribed opioids?: no  Dietary Habits and Nutritional Risks How many meals a day?: (Patient-Rptd) 2 Eats fruit and vegetables daily?: (!) (Patient-Rptd) no Most meals are obtained by: (Patient-Rptd) preparing own meals In the last 2 weeks, have you had any of the following?: none Diabetic:: no  Functional Status Activities of Daily Living (to include ambulation/medication): (Patient-Rptd) Independent Ambulation: (Patient-Rptd) Independent Medication Administration: (Patient-Rptd) Independent Home Management (perform basic housework or laundry): (Patient-Rptd) Independent Manage your own finances?: (Patient-Rptd) yes Primary transportation is: (Patient-Rptd) driving Concerns about vision?: (Patient-Rptd) no *vision screening is required for WTM* Concerns about hearing?: (Patient-Rptd) no  Fall Screening Falls in the past year?: (Patient-Rptd) 0 Number of falls in  past year: 0 Was there an injury with Fall?: 0 Fall Risk Category Calculator: 0 Patient Fall Risk Level: Low Fall Risk  Fall Risk Patient at Risk for Falls Due to: No Fall Risks Fall risk Follow up: Falls evaluation completed  Home and Transportation Safety: All rugs have non-skid backing?: (Patient-Rptd) yes All stairs or steps have railings?: (Patient-Rptd) N/A, no stairs Grab bars in the bathtub or shower?: (Patient-Rptd) yes Have non-skid surface in bathtub or shower?: (!) (Patient-Rptd) no Good home lighting?: (Patient-Rptd) yes Regular seat belt use?: (Patient-Rptd) yes Hospital stays in the last year:: (Patient-Rptd) no  Cognitive Assessment Difficulty concentrating, remembering, or making decisions? : (Patient-Rptd) no  Advance Directives (For Healthcare) Does Patient Have a Medical Advance Directive?: No  Reviewed/Updated  Reviewed/Updated: Reviewed All (Medical, Surgical, Family, Medications, Allergies, Care Teams, Patient Goals)    Allergies (verified) Tamsulosin  hcl   Current Medications (verified) Outpatient Encounter Medications as of 03/29/2024  Medication Sig   diclofenac  Sodium (VOLTAREN ) 1 % GEL APPLY 2 G TOPICALLY UPTO 4 TIMES DAILY. USE ON AFFECTED JOINT AS NEEDED. 2 GRAMS IS ROUGHLY 4 FINGERTIPS' WORTH OF GEL.   fenofibrate  (TRICOR ) 145 MG tablet Take 1 tablet (145 mg total) by mouth daily.   Multiple Vitamin (ONE-A-DAY MENS PO) Take 1 tablet by mouth daily.   pantoprazole  (PROTONIX ) 40 MG tablet    Probiotic Product (PROBIOTIC PO) Take by mouth.   sertraline  (ZOLOFT ) 50 MG tablet TAKE 1 TABLET BY MOUTH EVERY DAY   tadalafil  (CIALIS ) 20 MG tablet Take 1 tablet (20 mg total) by mouth daily as needed for erectile dysfunction.   tadalafil , PAH, (ADCIRCA ) 20 MG tablet TAKE 1 TABLET BY MOUTH EVERY DAY AS NEEDED ERECTILE DYSFUNCTION   UNABLE TO FIND Med Name: [place] to cleanse liver   [DISCONTINUED] hydrochlorothiazide  (HYDRODIURIL ) 12.5 MG tablet Take  1  tablet (12.5 mg total) by mouth daily.   [DISCONTINUED] lisinopril  (ZESTRIL ) 20 MG tablet TAKE 1 TABLET BY MOUTH EVERY DAY   [DISCONTINUED] pravastatin  (PRAVACHOL ) 40 MG tablet TAKE 1 TABLET BY MOUTH EVERY DAY   hydrochlorothiazide  (HYDRODIURIL ) 12.5 MG tablet Take 1 tablet (12.5 mg total) by mouth daily.   lisinopril  (ZESTRIL ) 20 MG tablet Take 1 tablet (20 mg total) by mouth daily.   pravastatin  (PRAVACHOL ) 40 MG tablet Take 1 tablet (40 mg total) by mouth daily.   [DISCONTINUED] WEGOVY  0.25 MG/0.5ML SOAJ SQ injection Inject 0.25 mg into the skin once a week. (Patient not taking: Reported on 03/29/2024)   No facility-administered encounter medications on file as of 03/29/2024.    History: Past Medical History:  Diagnosis Date   Benign prostatic hyperplasia 05/07/2019   Cancer [facility])    prostate   CHF (congestive heart failure) (HCC)    Depression    Elevated PSA 01/13/2021   Hyperlipemia    Hypertension    Kidney failure    Sleep apnea    Vitiligo    Past Surgical History:  Procedure Laterality Date   COLONOSCOPY N/A 08/16/2022   Procedure: COLONOSCOPY;  Surgeon: Onita Elspeth Sharper, DO;  Location: [facility] ENDOSCOPY;  Service: Gastroenterology;  Laterality: N/A;   COLONOSCOPY WITH PROPOFOL  N/A 12/16/2014   Procedure: COLONOSCOPY WITH PROPOFOL ;  Surgeon: Donnice Vaughn Manes, MD;  Location: [facility] ENDOSCOPY;  Service: Endoscopy;  Laterality: N/A;   PELVIC LYMPH NODE DISSECTION Bilateral 04/27/2021   Procedure: PELVIC LYMPH NODE DISSECTION;  Surgeon: [name], MD;  Location: [facility] ORS;  Service: Urology;  Laterality: Bilateral;   POLYPECTOMY  08/16/2022   Procedure: POLYPECTOMY;  Surgeon: Onita Elspeth Sharper, DO;  Location: [facility] ENDOSCOPY;  Service: Gastroenterology;;   PROSTATE BIOPSY     ROBOT ASSISTED LAPAROSCOPIC RADICAL PROSTATECTOMY N/A 04/27/2021   Procedure: XI ROBOTIC ASSISTED LAPAROSCOPIC RADICAL PROSTATECTOMY;  Surgeon: [name], MD;  Location: [facility] ORS;  Service:  Urology;  Laterality: N/A;   Family History  Problem Relation Age of Onset   Hypertension Mother    Cancer Father    Hypertension Father    Prostate cancer Neg Hx    Bladder Cancer Neg Hx    Kidney cancer Neg Hx    Social History   Occupational History   Not on file  Tobacco Use   Smoking status: Former    Current packs/day: 0.00    Average packs/day: 0.5 packs/day for 1 year (0.5 ttl pk-yrs)    Types: Cigarettes    Start date: 10/27/1997    Quit date: 10/28/1998    Years since quitting: 25.4    Passive exposure: Never   Smokeless tobacco: Never  Vaping Use   Vaping status: Never Used  Substance and Sexual Activity   Alcohol use: Not Currently    Comment: rarley   Drug use: Not Currently    Comment: marijuana   Sexual activity: Not Currently   Tobacco Counseling Counseling given: Not Answered  SDOH Screenings   Food Insecurity: No Food Insecurity (03/29/2024)  Housing: Unknown (03/29/2024)  Transportation Needs: No Transportation Needs (03/29/2024)  Utilities: Not At Risk (04/19/2023)  Alcohol Screen: Low Risk (03/29/2024)  Depression (PHQ2-9): Low Risk (03/29/2024)  Financial Resource Strain: Low Risk (03/29/2024)  Physical Activity: Inactive (03/29/2024)  Social Connections: Socially Isolated (03/29/2024)  Stress: No Stress Concern Present (03/29/2024)  Tobacco Use: Medium Risk (03/29/2024)  Health Literacy: Adequate Health Literacy (03/29/2024)   See flowsheets for full screening details  Depression Screen  PHQ 2 & 9 Depression Scale- Over the past 2 weeks, how often have you been bothered by any of the following problems? Little interest or pleasure in doing things: 0 Feeling down, depressed, or hopeless (PHQ Adolescent also includes...irritable): 0 PHQ-2 Total Score: 0 Trouble falling or staying asleep, or sleeping too much: 0 Feeling tired or having little energy: 1 Poor appetite or overeating (PHQ Adolescent also includes...weight loss): 0 Feeling bad about  yourself - or that you are a failure or have let yourself or your family down: 0 Trouble concentrating on things, such as reading the newspaper or watching television (PHQ Adolescent also includes...like school work): 0 Moving or speaking so slowly that other people could have noticed. Or the opposite - being so fidgety or restless that you have been moving around a lot more than usual: 0 Thoughts that you would be better off dead, or of hurting yourself in some way: 0 PHQ-9 Total Score: 1 If you checked off any problems, how difficult have these problems made it for you to do your work, take care of things at home, or get along with other people?: Not difficult at all      Goals Addressed   None          Objective:    Today's Vitals   03/29/24 1013  BP: (!) 146/98  Pulse: 71  Temp: 98.5 F (36.9 C)  TempSrc: Oral  SpO2: 95%  Weight: 289 lb 9.6 oz (131.4 kg)  Height: 6' (1.829 m)   Body mass index is 39.28 kg/m.   Physical Exam Vitals and nursing note reviewed.  Constitutional:      Appearance: Normal appearance. He is obese.  Cardiovascular:     Rate and Rhythm: Normal rate and regular rhythm.     Heart sounds: No murmur heard.    No friction rub. No gallop.  Pulmonary:     Effort: Pulmonary effort is normal.     Breath sounds: Normal breath sounds.  Abdominal:     General: There is no distension.  Musculoskeletal:        General: Normal range of motion.  Skin:    General: Skin is warm and dry.  Neurological:     Mental Status: He is alert and oriented to person, place, and time.     Gait: Gait is intact.  Psychiatric:        Mood and Affect: Mood and affect normal.     EKG: Sinus rhythm at 71 bpm. No concerning ST changes. Left axis deviation.     Hearing/Vision screen Vision Screening   Right eye Left eye Both eyes  Without correction     With correction 20/30 20/30 20/25    Immunizations and Health Maintenance Health Maintenance  Topic Date Due    HIV Screening  Never done   Zoster Vaccines- Shingrix  (2 of 2) 12/13/2022   COVID-19 Vaccine (4 - 2025-26 season) 10/31/2023   Medicare Annual Wellness (AWV)  03/29/2025   Colonoscopy  08/15/2032   Pneumococcal Vaccine: 50+ Years  Completed   Influenza Vaccine  Completed   Hepatitis B Vaccines 19-59 Average Risk  Aged Out   Meningococcal B Vaccine  Aged Out   DTaP/Tdap/Td  Discontinued   Hepatitis C Screening  Discontinued         Assessment/Plan:  This is a routine wellness examination for [facility].  Patient Care Team: Manya Toribio SQUIBB, PA as PCP - General (Physician Assistant) [name], MD (Inactive) as Consulting Physician (Urology)  Lenn Aran, MD as Consulting Physician (Radiation Oncology)  I have personally reviewed and noted the following in the patients chart:   Medical and social history Use of alcohol, tobacco or illicit drugs  Current medications and supplements including opioid prescriptions. Functional ability and status Nutritional status Physical activity Advanced directives List of other physicians Hospitalizations, surgeries, and ER visits in previous 12 months Vitals Screenings to include cognitive, depression, and falls Referrals and appointments  Orders Placed This Encounter  Procedures   Pneumococcal conjugate vaccine 20-valent   Flu vaccine HIGH DOSE PF(Fluzone Trivalent)   EKG 12-Lead   In addition, I have reviewed and discussed with patient certain preventive protocols, quality metrics, and best practice recommendations. A written personalized care plan for preventive services as well as general preventive health recommendations were provided to patient.  -------  Encouraged Shingrix  dose #2 at pharmacy.  Refilled antihypertensives and pravastatin , f/u 6wks OV with me to assess.   Will check insurance coverage for Zepbound  on new plan. Discussed various options for pharmacotherapy. Ultimately through shared decision making we  will initiate GLP-1 RA medication.  We discussed risks, benefits, mechanism of action, and common side effects from this medication.  No family history of thyroid cancer or MEN2.  No personal history of gallstones or pancreatitis.  Administration instructions reviewed and demonstrated to patient today.  Will initiate prior auth.  Advised the importance of adequate protein intake on this medication as well as resistance training which will not only improve insulin sensitivity but also build muscle mass and reduce risk of atrophy.  I recommended minimum 20 min sessions 3x/wk for strength training, with overall exercise goal of 150 min moderate intensity per week. Advised importance of portion control and slower eating on this medication to minimize GI side effects. Avoid high fat foods as these may cause discomfort. Patient aware of potential for weight regain when eventually stopping the medication. For this reason, continued efforts toward improving lifestyle will be particularly important moving forward. Patient will have routine and ongoing support for nutrition guidance, lifestyle modification, and medication adjustments.    ------  Rolan Hoyle, PA-C, DMSc, DipACLM, Nutritionist [facility] [phone]   03/29/2024   Return in 1 year (on 03/29/2025).

## 2024-03-29 NOTE — Telephone Encounter (Signed)
 SABRA

## 2024-03-29 NOTE — Telephone Encounter (Signed)
 Patient informed via phone. He sounds excited. Rx sent to [facility]. Patient to let me know if any surprises regarding cost, inventory, etc. Resched 3/17 f/u to late Feb instead (35m f/u Zepbound )

## 2024-03-29 NOTE — Telephone Encounter (Signed)
 Pharmacy Patient Advocate Encounter   Received notification from Pt Calls Messages that prior authorization for Zepbound  2.5 mg/0.5ml pen is required/requested.   Insurance verification completed.   The patient is insured through [facility].   Per test claim: The current 28 day co-pay is, $0.00.  No PA needed at this time. This test claim was processed through [facility]- copay amounts may vary at other pharmacies due to pharmacy/plan contracts, or as the patient moves through the different stages of their insurance plan.

## 2024-04-03 ENCOUNTER — Other Ambulatory Visit: Payer: Self-pay | Admitting: Physician Assistant

## 2024-04-03 DIAGNOSIS — G4733 Obstructive sleep apnea (adult) (pediatric): Secondary | ICD-10-CM

## 2024-04-03 MED ORDER — ZEPBOUND 2.5 MG/0.5ML ~~LOC~~ SOAJ: 2.5000 mg | SUBCUTANEOUS | 0 refills | Status: AC

## 2024-04-03 NOTE — Telephone Encounter (Signed)
 Please review.  KP

## 2024-04-04 ENCOUNTER — Other Ambulatory Visit (HOSPITAL_COMMUNITY): Payer: Self-pay

## 2024-04-04 NOTE — Telephone Encounter (Signed)
 Is that insurance till active?  KP

## 2024-04-04 NOTE — Telephone Encounter (Signed)
 Hey, the prescription coverage that we have is [employer] ([employer]) which is handled through [employer], see below:

## 2024-04-04 NOTE — Telephone Encounter (Signed)
 Hey, I called the pharmacy they said Zepbound  isn't covered by his insurance. It looks like he updated his card and its [facility] Medicare is that the coverage that didn't need an approval?  KP

## 2024-04-05 ENCOUNTER — Other Ambulatory Visit (HOSPITAL_COMMUNITY): Payer: Self-pay

## 2024-04-05 NOTE — Telephone Encounter (Signed)
 Good morning, no that insurance is no longer active as of 03/01/24. I meant to send this information that we currently have for his prescription coverage, my apologies.[email or web address]

## 2024-04-05 NOTE — Telephone Encounter (Signed)
 Please review. New insurance does not cover medication.  KP

## 2024-04-16 ENCOUNTER — Ambulatory Visit: Admitting: Urology

## 2024-04-24 ENCOUNTER — Ambulatory Visit: Admitting: Physician Assistant

## 2024-05-15 ENCOUNTER — Ambulatory Visit: Admitting: Physician Assistant
